# Patient Record
Sex: Female | Born: 1990 | Race: White | Hispanic: No | Marital: Single | Smoking: Former smoker
Health system: Southern US, Community
[De-identification: ages and names within clinical notes are randomized; demographics above are authoritative.]

## PROBLEM LIST (undated history)

## (undated) DIAGNOSIS — D649 Anemia, unspecified: Secondary | ICD-10-CM

## (undated) DIAGNOSIS — N390 Urinary tract infection, site not specified: Secondary | ICD-10-CM

## (undated) HISTORY — PX: NO PAST SURGERIES: SHX2092

## (undated) HISTORY — PX: DILATION AND CURETTAGE OF UTERUS: SHX78

---

## 2012-01-02 ENCOUNTER — Emergency Department (HOSPITAL_BASED_OUTPATIENT_CLINIC_OR_DEPARTMENT_OTHER)
Admission: EM | Admit: 2012-01-02 | Discharge: 2012-01-02 | Disposition: A | Discharge status: Home / Self Care | Payer: Self-pay | Attending: Emergency Medicine | Admitting: Emergency Medicine

## 2012-01-02 ENCOUNTER — Encounter (HOSPITAL_BASED_OUTPATIENT_CLINIC_OR_DEPARTMENT_OTHER): Payer: Self-pay | Admitting: Emergency Medicine

## 2012-01-02 DIAGNOSIS — B86 Scabies: Secondary | ICD-10-CM | POA: Insufficient documentation

## 2012-01-02 MED ORDER — HYDROXYZINE HCL 25 MG PO TABS: 25.0000 mg | ORAL_TABLET | Freq: Four times a day (QID) | ORAL | Status: AC

## 2012-01-02 MED ORDER — PERMETHRIN 5 % EX CREA: TOPICAL_CREAM | CUTANEOUS | Status: AC

## 2012-01-02 NOTE — ED Notes (Signed)
Itchy rash to webs of hands, feet and abdomen.  Roommate was diagnosed with scabies two weeks ago.

## 2012-01-02 NOTE — ED Provider Notes (Signed)
History     CSN: [id number]  Arrival date & time 01/02/12  1000   First MD Initiated Contact with Patient 01/02/12 1035      Chief Complaint  Patient presents with  . Rash    (Consider location/radiation/quality/duration/timing/severity/associated sxs/prior treatment) HPI Comments: Patient presents with itchy rash that has been going on about 2 weeks now. It's mostly on her hands and her feet but also in her abdomen. Her boyfriend has the same rash. They were also recently exposed to scabies. Denies a fevers cough congestion or other recent illnesses. She has been using Benadryl with no relief.  Patient is a 21 y.o. female presenting with rash. The history is provided by the patient.  Rash  This is a new problem.    History reviewed. No pertinent past medical history.  History reviewed. No pertinent past surgical history.  History reviewed. No pertinent family history.  History  Substance Use Topics  . Smoking status: Not on file  . Smokeless tobacco: Not on file  . Alcohol Use: Not on file    OB History    Grav Para Term Preterm Abortions TAB SAB Ect Mult Living                  Review of Systems  Constitutional: Negative for chills and fatigue.  HENT: Negative for facial swelling.   Respiratory: Negative for cough and shortness of breath.   Cardiovascular: Negative for chest pain.  Musculoskeletal: Negative for joint swelling.  Skin: Positive for rash.  Neurological: Negative for headaches.    Allergies  Review of patient's allergies indicates no known allergies.  Home Medications   Current Outpatient Rx  Name Route Sig Dispense Refill  . CIPROFLOXACIN HCL 500 MG PO TABS Oral Take 500 mg by mouth 2 (two) times daily.    Marland Kitchen HYDROXYZINE HCL 25 MG PO TABS Oral Take 1 tablet (25 mg total) by mouth every 6 (six) hours. 12 tablet 0  . PERMETHRIN 5 % EX CREA  Apply to affected area once 60 g 0    BP 117/70  Pulse 97  Temp(Src) 98.3 F (36.8 C) (Oral)  Resp  18  SpO2 100%  LMP 11/28/2011  Physical Exam  Constitutional: She is oriented to person, place, and time. She appears well-developed and well-nourished.  HENT:  Head: Normocephalic and atraumatic.  Eyes: Pupils are equal, round, and reactive to light.  Neck: Normal range of motion. Neck supple.  Cardiovascular: Normal rate, regular rhythm and normal heart sounds.   Pulmonary/Chest: Effort normal and breath sounds normal. No respiratory distress. She has no wheezes. She has no rales. She exhibits no tenderness.  Abdominal: Soft. Bowel sounds are normal. There is no tenderness. There is no rebound and no guarding.  Musculoskeletal: Normal range of motion. She exhibits no edema.  Lymphadenopathy:    She has no cervical adenopathy.  Neurological: She is alert and oriented to person, place, and time.  Skin: Skin is warm and dry. Rash noted.       There are small papules present to the dorsal surface of both hands including the web spaces of the hands. There is also papules on her feet and abdomen. There is no petechiae no purpura no signs of cellulitis no vesicular lesions.  Psychiatric: She has a normal mood and affect.    ED Course  Procedures (including critical care time)  Labs Reviewed - No data to display No results found.   1. Scabies  MDM  Patient with apparent scabies present no signs of cellulitis present will treat with Elimite cream and Atarax for symptomatic care        Rolan Bucco, MD 01/02/12 1525

## 2012-06-09 ENCOUNTER — Encounter (HOSPITAL_BASED_OUTPATIENT_CLINIC_OR_DEPARTMENT_OTHER): Payer: Self-pay | Admitting: Emergency Medicine

## 2012-06-09 ENCOUNTER — Emergency Department (HOSPITAL_BASED_OUTPATIENT_CLINIC_OR_DEPARTMENT_OTHER)
Admission: EM | Admit: 2012-06-09 | Discharge: 2012-06-09 | Disposition: A | Discharge status: Home / Self Care | Payer: Self-pay | Attending: Emergency Medicine | Admitting: Emergency Medicine

## 2012-06-09 DIAGNOSIS — N390 Urinary tract infection, site not specified: Secondary | ICD-10-CM | POA: Insufficient documentation

## 2012-06-09 DIAGNOSIS — F17200 Nicotine dependence, unspecified, uncomplicated: Secondary | ICD-10-CM | POA: Insufficient documentation

## 2012-06-09 HISTORY — DX: Urinary tract infection, site not specified: N39.0

## 2012-06-09 LAB — URINALYSIS, ROUTINE W REFLEX MICROSCOPIC
Bilirubin Urine: NEGATIVE
Nitrite: NEGATIVE
Protein, ur: 30 mg/dL — AB
Specific Gravity, Urine: 1.013 (ref 1.005–1.030)
Urobilinogen, UA: 0.2 mg/dL (ref 0.0–1.0)

## 2012-06-09 LAB — PREGNANCY, URINE: Preg Test, Ur: NEGATIVE

## 2012-06-09 LAB — URINE MICROSCOPIC-ADD ON

## 2012-06-09 MED ORDER — SULFAMETHOXAZOLE-TRIMETHOPRIM 800-160 MG PO TABS: 1.0000 | ORAL_TABLET | Freq: Two times a day (BID) | ORAL | Status: AC

## 2012-06-09 NOTE — Discharge Instructions (Signed)

## 2012-06-09 NOTE — ED Provider Notes (Signed)
Medical screening examination/treatment/procedure(s) were performed by non-physician practitioner and as supervising physician I was immediately available for consultation/collaboration.  Anjenette Gerbino T Kartier Bennison, MD 06/09/12 2317 

## 2012-06-09 NOTE — ED Notes (Signed)
Pt with history of uti, +dysruria, and frequent urination

## 2012-06-09 NOTE — ED Provider Notes (Signed)
History     CSN: [id number]  Arrival date & time 06/09/12  4098   First MD Initiated Contact with Patient 06/09/12 1927      Chief Complaint  Patient presents with  . Urinary Tract Infection    (Consider location/radiation/quality/duration/timing/severity/associated sxs/prior treatment) HPI Comments: Pt c/o burning with urination  Patient is a 21 y.o. female presenting with urinary tract infection. The history is provided by the patient. No language interpreter was used.  Urinary Tract Infection This is a recurrent problem. The current episode started in the past 7 days. The problem has been unchanged. Pertinent negatives include no fever. Nothing aggravates the symptoms. She has tried nothing for the symptoms.    Past Medical History  Diagnosis Date  . UTI (urinary tract infection)     History reviewed. No pertinent past surgical history.  History reviewed. No pertinent family history.  History  Substance Use Topics  . Smoking status: Current Everyday Smoker -- 0.5 packs/day    Types: Cigarettes  . Smokeless tobacco: Not on file  . Alcohol Use: Yes     ocassionally    OB History    Grav Para Term Preterm Abortions TAB SAB Ect Mult Living                  Review of Systems  Constitutional: Negative for fever.  Respiratory: Negative.   Cardiovascular: Negative.   Neurological: Negative.     Allergies  Review of patient's allergies indicates no known allergies.  Home Medications   Current Outpatient Rx  Name Route Sig Dispense Refill  . BENADRYL ALLERGY PO Oral Take 1 tablet by mouth daily as needed. Patient used this medication a rash.    . IBUPROFEN 200 MG PO TABS Oral Take 800 mg by mouth every 6 (six) hours as needed. Patient used this medication for her tooth pain.    Marland Kitchen NYQUIL PO Oral Take 2 capsules by mouth daily as needed. Patient used this medication for her cold symptoms.    Marland Kitchen CIPROFLOXACIN HCL 500 MG PO TABS Oral Take 500 mg by mouth 2 (two)  times daily.    . SULFAMETHOXAZOLE-TRIMETHOPRIM 800-160 MG PO TABS Oral Take 1 tablet by mouth 2 (two) times daily. 6 tablet 0    BP 126/63  Pulse 116  Temp 98.8 F (37.1 C) (Oral)  Resp 18  Ht 5\' 5"  (1.651 m)  Wt 160 lb (72.576 kg)  BMI 26.63 kg/m2  SpO2 100%  LMP 05/26/2012  Physical Exam  Nursing note and vitals reviewed. Constitutional: She is oriented to person, place, and time. She appears well-developed and well-nourished.  HENT:  Head: Normocephalic and atraumatic.  Cardiovascular: Normal rate and regular rhythm.   Pulmonary/Chest: Effort normal and breath sounds normal.  Abdominal: Soft. Bowel sounds are normal. There is no tenderness. There is no CVA tenderness.  Musculoskeletal: Normal range of motion.  Neurological: She is alert and oriented to person, place, and time.  Skin: Skin is warm and dry.  Psychiatric: She has a normal mood and affect.    ED Course  Procedures (including critical care time)  Labs Reviewed  URINALYSIS, ROUTINE W REFLEX MICROSCOPIC - Abnormal; Notable for the following:    APPearance CLOUDY (*)     Hgb urine dipstick LARGE (*)     Protein, ur 30 (*)     Leukocytes, UA LARGE (*)     All other components within normal limits  URINE MICROSCOPIC-ADD ON - Abnormal; Notable for the following:  Squamous Epithelial / LPF FEW (*)     Bacteria, UA FEW (*)     All other components within normal limits  PREGNANCY, URINE  URINE CULTURE   No results found.   1. UTI (lower urinary tract infection)       MDM  Abdomen is benign:willt treat for simple uti       Teressa Lower, NP 06/09/12 2106

## 2012-06-11 LAB — URINE CULTURE: Colony Count: >=100000

## 2012-06-12 NOTE — ED Notes (Signed)
Results received from [facility]. (+) URNC -> >/= 100,000 colonies E Coli.  Rx for Sulfa-Trimeth -> Resistant to the same.  Chart to MD office for review.

## 2012-06-16 NOTE — ED Notes (Signed)
attempts made to contact patient by x 2.

## 2012-06-16 NOTE — ED Notes (Signed)
Change to Keflex 500 mg TID x 1 week per Dr Oletta Lamas.

## 2012-09-08 ENCOUNTER — Encounter (HOSPITAL_BASED_OUTPATIENT_CLINIC_OR_DEPARTMENT_OTHER): Payer: Self-pay | Admitting: *Deleted

## 2012-09-08 ENCOUNTER — Emergency Department (HOSPITAL_BASED_OUTPATIENT_CLINIC_OR_DEPARTMENT_OTHER)
Admission: EM | Admit: 2012-09-08 | Discharge: 2012-09-08 | Disposition: A | Discharge status: Home / Self Care | Payer: Self-pay | Attending: Emergency Medicine | Admitting: Emergency Medicine

## 2012-09-08 DIAGNOSIS — S025XXA Fracture of tooth (traumatic), initial encounter for closed fracture: Secondary | ICD-10-CM | POA: Insufficient documentation

## 2012-09-08 DIAGNOSIS — K089 Disorder of teeth and supporting structures, unspecified: Secondary | ICD-10-CM | POA: Insufficient documentation

## 2012-09-08 DIAGNOSIS — X58XXXA Exposure to other specified factors, initial encounter: Secondary | ICD-10-CM | POA: Insufficient documentation

## 2012-09-08 DIAGNOSIS — F17200 Nicotine dependence, unspecified, uncomplicated: Secondary | ICD-10-CM | POA: Insufficient documentation

## 2012-09-08 DIAGNOSIS — K0889 Other specified disorders of teeth and supporting structures: Secondary | ICD-10-CM

## 2012-09-08 MED ORDER — BUPIVACAINE-EPINEPHRINE PF 0.5-1:200000 % IJ SOLN
10.0000 mL | Freq: Once | INTRAMUSCULAR | Status: AC
  Administered 2012-09-08: 50 mg
  Filled 2012-09-08: qty 10

## 2012-09-08 MED ORDER — BUPIVACAINE-EPINEPHRINE (PF) 0.5% -1:200000 IJ SOLN
INTRAMUSCULAR | Status: AC
  Filled 2012-09-08: qty 1.8

## 2012-09-08 MED ORDER — TRAMADOL HCL 50 MG PO TABS
50.0000 mg | ORAL_TABLET | Freq: Four times a day (QID) | ORAL | Status: DC | PRN
Start: 2012-09-08 — End: 2019-11-06

## 2012-09-08 NOTE — ED Notes (Signed)
Discharge instructions reviewed. Pt verbalized understanding.  

## 2012-09-08 NOTE — ED Notes (Signed)
Pain started on left side of mouth yesterday today cant tell which tooth pain shooting into head has taken tylenol and ibuprofen and benadryl

## 2012-09-21 NOTE — ED Provider Notes (Signed)
History     CSN: [id number]  Arrival date & time 09/08/12  4401   First MD Initiated Contact with Patient 09/08/12 [phone]      Chief Complaint  Patient presents with  . Dental Pain    (Consider location/radiation/quality/duration/timing/severity/associated sxs/prior treatment) HPIKristen Wagner is a 21 y.o. female presenting with left-sided facial pain due to   tooth requiring dental work. This is been going on for months. Tramadol has worked for her in the past but she's out of this medicine. Her pain is currently 10/10, shooting pain shoots into her face, associated with headache, no associated fevers, chills, difficulty swallowing, swelling of her tongue, stiff neck.  She says she has not gone to the dentist because she does not have insurance.   Past Medical History  Diagnosis Date  . UTI (urinary tract infection)     History reviewed. No pertinent past surgical history.  History reviewed. No pertinent family history.  History  Substance Use Topics  . Smoking status: Current Every Day Smoker -- 0.5 packs/day    Types: Cigarettes  . Smokeless tobacco: Not on file  . Alcohol Use: Yes     ocassionally    OB History    Grav Para Term Preterm Abortions TAB SAB Ect Mult Living                  Review of Systems At least 10pt or greater review of systems completed and are negative except where specified in the HPI.  Allergies  Review of patient's allergies indicates no known allergies.  Home Medications   Current Outpatient Rx  Name Route Sig Dispense Refill  . CIPROFLOXACIN HCL 500 MG PO TABS Oral Take 500 mg by mouth 2 (two) times daily.    Marland Kitchen BENADRYL ALLERGY PO Oral Take 1 tablet by mouth daily as needed. Patient used this medication a rash.    . IBUPROFEN 200 MG PO TABS Oral Take 800 mg by mouth every 6 (six) hours as needed. Patient used this medication for her tooth pain.    Marland Kitchen NYQUIL PO Oral Take 2 capsules by mouth daily as needed. Patient used this  medication for her cold symptoms.    . TRAMADOL HCL 50 MG PO TABS Oral Take 1-2 tablets (50-100 mg total) by mouth every 6 (six) hours as needed for pain. 10 tablet 0    BP 114/66  Pulse 71  Temp 98.3 F (36.8 C)  Resp 20  SpO2 100%  LMP 08/07/2012  Physical Exam  Nursing notes reviewed.  Electronic medical record reviewed. VITAL SIGNS:   Filed Vitals:   09/08/12 0730 09/08/12 0731  BP: 114/66   Pulse: 71   Temp:  98.3 F (36.8 C)  TempSrc: Oral   Resp: 20   SpO2: 100%    CONSTITUTIONAL: Awake, oriented, appears non-toxic HENT: Atraumatic, normocephalic, oral mucosa pink and moist, airway patent.  Poor dental hygiene. Carries to left-sided molars. No tenderness to percussion in the molars and no periapical abscess seen-no erythema, no gingival irritation or gingivitis.  Nares patent without drainage. External ears normal. EYES: Conjunctiva clear, EOMI, PERRLA NECK: Trachea midline, non-tender, supple CARDIOVASCULAR: Normal heart rate, Normal rhythm, No murmurs, rubs, gallops PULMONARY/CHEST: Clear to auscultation, no rhonchi, wheezes, or rales. Symmetrical breath sounds. Non-tender. ABDOMINAL: Non-distended, soft, non-tender - no rebound or guarding.  BS normal. NEUROLOGIC: Non-focal, moving all four extremities, no gross sensory or motor deficits. EXTREMITIES: No clubbing, cyanosis, or edema SKIN: Warm, Dry, No erythema, No rash  ED Course  Procedures (including critical care time)  Labs Reviewed - No data to display No results found.   1. Tooth pain   2. Tooth fracture       MDM  Jacqueline Wagner is a 21 y.o. female presenting with dental pain-she obviously needs some dental work by the state of her teeth, she says this is been a long-standing problem. Given her some resources and finding it [facility] and tramadol for pain relief since it seems to be working well. Patient does not have any focal area suggesting an  abscess tooth or deeper space infection.   I explained the diagnosis and have given explicit precautions to return to the ER including fevers, chills, difficulty swallowing, stiff neck or any other new or worsening symptoms. The patient understands and accepts the medical plan as it's been dictated and I have answered their questions. Discharge instructions concerning home care and prescriptions have been given.  The patient is STABLE and is discharged to home in good condition.          Jones Skene, MD 09/21/12 1143

## 2012-09-24 ENCOUNTER — Emergency Department (HOSPITAL_BASED_OUTPATIENT_CLINIC_OR_DEPARTMENT_OTHER)
Admission: EM | Admit: 2012-09-24 | Discharge: 2012-09-24 | Disposition: A | Discharge status: Home / Self Care | Payer: Self-pay | Attending: Emergency Medicine | Admitting: Emergency Medicine

## 2012-09-24 ENCOUNTER — Encounter (HOSPITAL_BASED_OUTPATIENT_CLINIC_OR_DEPARTMENT_OTHER): Payer: Self-pay | Admitting: *Deleted

## 2012-09-24 DIAGNOSIS — N390 Urinary tract infection, site not specified: Secondary | ICD-10-CM | POA: Insufficient documentation

## 2012-09-24 DIAGNOSIS — F17200 Nicotine dependence, unspecified, uncomplicated: Secondary | ICD-10-CM | POA: Insufficient documentation

## 2012-09-24 LAB — URINALYSIS, ROUTINE W REFLEX MICROSCOPIC
Bilirubin Urine: NEGATIVE
Hgb urine dipstick: NEGATIVE
Ketones, ur: NEGATIVE mg/dL
Specific Gravity, Urine: 1.025 (ref 1.005–1.030)
pH: 6.5 (ref 5.0–8.0)

## 2012-09-24 MED ORDER — CIPROFLOXACIN HCL 500 MG PO TABS: 250.0000 mg | ORAL_TABLET | Freq: Two times a day (BID) | ORAL | Status: DC

## 2012-09-24 NOTE — ED Provider Notes (Signed)
History     CSN: [id number]  Arrival date & time 09/24/12  7829   First MD Initiated Contact with Patient 09/24/12 1937      Chief Complaint  Patient presents with  . Urinary Frequency    (Consider location/radiation/quality/duration/timing/severity/associated sxs/prior treatment) HPI Comments: Patient is a healthy 21 year old female with a 2 day history of urinary frequency and cloudy urine. She reports noticing these symptoms every time she urinates for the past 2 days. She reports frequent UTI's despite following precautionary advice for prevention. Patient denies pain. No aggravating/alleviating factors. She has not tried anything for symptoms. She denies fever, NVD, chest pain, SOB, abdominal pain, hematuria.    Past Medical History  Diagnosis Date  . UTI (urinary tract infection)     History reviewed. No pertinent past surgical history.  History reviewed. No pertinent family history.  History  Substance Use Topics  . Smoking status: Current Every Day Smoker -- 0.5 packs/day    Types: Cigarettes  . Smokeless tobacco: Not on file  . Alcohol Use: Yes     ocassionally    OB History    Grav Para Term Preterm Abortions TAB SAB Ect Mult Living                  Review of Systems  Genitourinary: Positive for frequency.  All other systems reviewed and are negative.    Allergies  Review of patient's allergies indicates no known allergies.  Home Medications   Current Outpatient Rx  Name Route Sig Dispense Refill  . CIPROFLOXACIN HCL 500 MG PO TABS Oral Take 500 mg by mouth 2 (two) times daily.    Marland Kitchen BENADRYL ALLERGY PO Oral Take 1 tablet by mouth daily as needed. Patient used this medication a rash.    . IBUPROFEN 200 MG PO TABS Oral Take 800 mg by mouth every 6 (six) hours as needed. Patient used this medication for her tooth pain.    Marland Kitchen NYQUIL PO Oral Take 2 capsules by mouth daily as needed. Patient used this medication for her cold symptoms.    . TRAMADOL HCL  50 MG PO TABS Oral Take 1-2 tablets (50-100 mg total) by mouth every 6 (six) hours as needed for pain. 10 tablet 0    BP 121/79  Pulse 110  Temp 98.4 F (36.9 C) (Oral)  Resp 18  Ht 5\' 4"  (1.626 m)  Wt 170 lb (77.111 kg)  BMI 29.18 kg/m2  SpO2 100%  LMP 09/07/2012  Physical Exam  Nursing note and vitals reviewed. Constitutional: She appears well-developed and well-nourished. No distress.  HENT:  Head: Normocephalic and atraumatic.  Eyes: Conjunctivae normal are normal. No scleral icterus.  Neck: Normal range of motion. Neck supple.  Cardiovascular: Normal rate and regular rhythm.  Exam reveals no gallop and no friction rub.   No murmur heard. Pulmonary/Chest: Effort normal and breath sounds normal. She has no wheezes. She has no rales. She exhibits no tenderness.  Abdominal: Soft. There is no tenderness.  Musculoskeletal: Normal range of motion.  Neurological: She is alert.       Speech is goal-oriented. Moves limbs without ataxia.   Skin: Skin is warm and dry.  Psychiatric: She has a normal mood and affect. Her behavior is normal.    ED Course  Procedures (including critical care time)   Labs Reviewed  PREGNANCY, URINE  URINALYSIS, ROUTINE W REFLEX MICROSCOPIC  URINE CULTURE   No results found.   1. UTI (urinary tract infection)  MDM  8:39 PM Patient's urine shows UTI. I will treat patient's infection with Cipro due to previous urine culture showing E. Coli. No further evaluation needed at this time. Patient instructed to follow up with primary care or urology for further evaluation of frequent UTIs. Patient expresses understanding and will follow up when she has insurance starting next month.         Emilia Beck, PA-C 09/25/12 0013

## 2012-09-24 NOTE — ED Notes (Addendum)
Pt states she has a hx of frequent UTI's in spite of using the necessary precautions to avoid same. Seen here 1 month ago for UTI and this one has been going on for about a week. Concerned she is a diabetic. Family hx Type 2 FSBS 142 at triage.

## 2012-09-26 NOTE — ED Provider Notes (Signed)
Medical screening examination/treatment/procedure(s) were performed by non-physician practitioner and as supervising physician I was immediately available for consultation/collaboration.   Gwyneth Sprout, MD 09/26/12 [phone]

## 2012-09-27 LAB — URINE CULTURE: Colony Count: 80000

## 2012-09-28 NOTE — ED Notes (Signed)
+   Urine Patient treated with Cipro-sensitive to same-chart appended per protocol MD. 

## 2015-03-07 ENCOUNTER — Emergency Department (HOSPITAL_BASED_OUTPATIENT_CLINIC_OR_DEPARTMENT_OTHER)
Admission: EM | Admit: 2015-03-07 | Discharge: 2015-03-07 | Disposition: A | Discharge status: Home / Self Care | Payer: 59 | Attending: Emergency Medicine | Admitting: Emergency Medicine

## 2015-03-07 ENCOUNTER — Encounter (HOSPITAL_BASED_OUTPATIENT_CLINIC_OR_DEPARTMENT_OTHER): Payer: Self-pay | Admitting: Emergency Medicine

## 2015-03-07 DIAGNOSIS — Z87891 Personal history of nicotine dependence: Secondary | ICD-10-CM | POA: Diagnosis not present

## 2015-03-07 DIAGNOSIS — R350 Frequency of micturition: Secondary | ICD-10-CM | POA: Diagnosis present

## 2015-03-07 DIAGNOSIS — Z792 Long term (current) use of antibiotics: Secondary | ICD-10-CM | POA: Insufficient documentation

## 2015-03-07 DIAGNOSIS — Z79899 Other long term (current) drug therapy: Secondary | ICD-10-CM | POA: Insufficient documentation

## 2015-03-07 DIAGNOSIS — N390 Urinary tract infection, site not specified: Secondary | ICD-10-CM | POA: Insufficient documentation

## 2015-03-07 LAB — URINALYSIS, ROUTINE W REFLEX MICROSCOPIC
Bilirubin Urine: NEGATIVE
GLUCOSE, UA: NEGATIVE mg/dL
HGB URINE DIPSTICK: NEGATIVE
Ketones, ur: 15 mg/dL — AB
Nitrite: POSITIVE — AB
PH: 5 (ref 5.0–8.0)
PROTEIN: NEGATIVE mg/dL
SPECIFIC GRAVITY, URINE: 1.039 — AB (ref 1.005–1.030)
Urobilinogen, UA: 1 mg/dL (ref 0.0–1.0)

## 2015-03-07 LAB — URINE MICROSCOPIC-ADD ON

## 2015-03-07 LAB — PREGNANCY, URINE: PREG TEST UR: NEGATIVE

## 2015-03-07 MED ORDER — PHENAZOPYRIDINE HCL 200 MG PO TABS: 200.0000 mg | ORAL_TABLET | Freq: Three times a day (TID) | ORAL | Status: DC

## 2015-03-07 MED ORDER — NITROFURANTOIN MONOHYD MACRO 100 MG PO CAPS: 100.0000 mg | ORAL_CAPSULE | Freq: Two times a day (BID) | ORAL | Status: DC

## 2015-03-07 MED ORDER — PHENAZOPYRIDINE HCL 100 MG PO TABS
200.0000 mg | ORAL_TABLET | Freq: Once | ORAL | Status: AC
  Administered 2015-03-07: 200 mg via ORAL

## 2015-03-07 MED ORDER — PHENAZOPYRIDINE HCL 100 MG PO TABS
ORAL_TABLET | ORAL | Status: AC
  Filled 2015-03-07: qty 2

## 2015-03-07 MED ORDER — NITROFURANTOIN MONOHYD MACRO 100 MG PO CAPS
ORAL_CAPSULE | ORAL | Status: AC
  Filled 2015-03-07: qty 1

## 2015-03-07 MED ORDER — NITROFURANTOIN MONOHYD MACRO 100 MG PO CAPS
100.0000 mg | ORAL_CAPSULE | Freq: Once | ORAL | Status: AC
  Administered 2015-03-07: 100 mg via ORAL

## 2015-03-07 NOTE — ED Provider Notes (Signed)
CSN: [id number]     Arrival date & time 03/07/15  0138 History   First MD Initiated Contact with Patient 03/07/15 0153     Chief Complaint  Patient presents with  . Urinary Frequency     (Consider location/radiation/quality/duration/timing/severity/associated sxs/prior Treatment) Patient is a 24 y.o. female presenting with frequency. The history is provided by the patient.  Urinary Frequency This is a new problem. The current episode started 12 to 24 hours ago. The problem occurs constantly. The problem has not changed since onset.Pertinent negatives include no abdominal pain. Nothing aggravates the symptoms. Nothing relieves the symptoms. Treatments tried: azo. The treatment provided no relief.    Past Medical History  Diagnosis Date  . UTI (urinary tract infection)    History reviewed. No pertinent past surgical history. No family history on file. History  Substance Use Topics  . Smoking status: Former Games developermoker  . Smokeless tobacco: Not on file  . Alcohol Use: No   OB History    No data available     Review of Systems  Gastrointestinal: Negative for abdominal pain.  Genitourinary: Positive for dysuria and frequency.  All other systems reviewed and are negative.     Allergies  Review of patient's allergies indicates no known allergies.  Home Medications   Prior to Admission medications   Medication Sig Start Date End Date Taking? Authorizing Provider  ciprofloxacin (CIPRO) 500 MG tablet Take 500 mg by mouth 2 (two) times daily.    Historical Provider, MD  ciprofloxacin (CIPRO) 500 MG tablet Take 0.5 tablets (250 mg total) by mouth 2 (two) times daily. 09/24/12   Kaitlyn Szekalski, PA-C  DiphenhydrAMINE HCl (BENADRYL ALLERGY PO) Take 1 tablet by mouth daily as needed. Patient used this medication a rash.    Historical Provider, MD  ibuprofen (ADVIL,MOTRIN) 200 MG tablet Take 800 mg by mouth every 6 (six) hours as needed. Patient used this medication for her tooth pain.     Historical Provider, MD  nitrofurantoin, macrocrystal-monohydrate, (MACROBID) 100 MG capsule Take 1 capsule (100 mg total) by mouth 2 (two) times daily. X 3 days 03/07/15   Quay Simkin, MD  phenazopyridine (PYRIDIUM) 200 MG tablet Take 1 tablet (200 mg total) by mouth 3 (three) times daily. 03/07/15   Pearson Reasons, MD  Pseudoeph-Doxylamine-DM-APAP (NYQUIL PO) Take 2 capsules by mouth daily as needed. Patient used this medication for her cold symptoms.    Historical Provider, MD  traMADol (ULTRAM) 50 MG tablet Take 1-2 tablets (50-100 mg total) by mouth every 6 (six) hours as needed for pain. 09/08/12   John-Adam Bonk, MD   BP 132/74 mmHg  Pulse 78  Temp(Src) 99.1 F (37.3 C) (Oral)  Resp 16  Ht 5\' 4"  (1.626 m)  Wt 197 lb (89.359 kg)  BMI 33.80 kg/m2  SpO2 99%  LMP 03/01/2015 (Exact Date) Physical Exam  Constitutional: She is oriented to person, place, and time. She appears well-developed and well-nourished. No distress.  HENT:  Head: Normocephalic and atraumatic.  Mouth/Throat: Oropharynx is clear and moist.  Eyes: Conjunctivae are normal. Pupils are equal, round, and reactive to light.  Neck: Normal range of motion. Neck supple.  Cardiovascular: Normal rate and regular rhythm.   Pulmonary/Chest: Effort normal and breath sounds normal. No respiratory distress. She has no wheezes. She has no rales.  Abdominal: Soft. Bowel sounds are normal. There is no tenderness. There is no rebound and no guarding.  Musculoskeletal: Normal range of motion.  Neurological: She is alert and oriented to  person, place, and time.  Skin: Skin is warm and dry.  Psychiatric: She has a normal mood and affect.    ED Course  Procedures (including critical care time) Labs Review Labs Reviewed  URINALYSIS, ROUTINE W REFLEX MICROSCOPIC - Abnormal; Notable for the following:    Color, Urine ORANGE (*)    Specific Gravity, Urine 1.039 (*)    Ketones, ur 15 (*)    Nitrite POSITIVE (*)    Leukocytes, UA  SMALL (*)    All other components within normal limits  URINE MICROSCOPIC-ADD ON - Abnormal; Notable for the following:    Bacteria, UA FEW (*)    All other components within normal limits  PREGNANCY, URINE    Imaging Review No results found.   EKG Interpretation None      MDM   Final diagnoses:  UTI (lower urinary tract infection)    Mild UTI, nitrites are likely from AZO.  Will write for macrobid x 3 days.  Follow up with your PMD for recheck    [place] Dodgen, MD 03/07/15 [phone]

## 2015-03-07 NOTE — ED Notes (Signed)
Urinary frequency since 4pm with dysuria.

## 2017-05-15 ENCOUNTER — Emergency Department (HOSPITAL_BASED_OUTPATIENT_CLINIC_OR_DEPARTMENT_OTHER): Payer: 59

## 2017-05-15 ENCOUNTER — Encounter (HOSPITAL_BASED_OUTPATIENT_CLINIC_OR_DEPARTMENT_OTHER): Payer: Self-pay | Admitting: Emergency Medicine

## 2017-05-15 ENCOUNTER — Emergency Department (HOSPITAL_BASED_OUTPATIENT_CLINIC_OR_DEPARTMENT_OTHER)
Admission: EM | Admit: 2017-05-15 | Discharge: 2017-05-15 | Disposition: A | Discharge status: Home / Self Care | Payer: 59 | Attending: Emergency Medicine | Admitting: Emergency Medicine

## 2017-05-15 DIAGNOSIS — Y939 Activity, unspecified: Secondary | ICD-10-CM | POA: Insufficient documentation

## 2017-05-15 DIAGNOSIS — Y92009 Unspecified place in unspecified non-institutional (private) residence as the place of occurrence of the external cause: Secondary | ICD-10-CM | POA: Diagnosis not present

## 2017-05-15 DIAGNOSIS — S42201A Unspecified fracture of upper end of right humerus, initial encounter for closed fracture: Secondary | ICD-10-CM | POA: Insufficient documentation

## 2017-05-15 DIAGNOSIS — S42291A Other displaced fracture of upper end of right humerus, initial encounter for closed fracture: Secondary | ICD-10-CM

## 2017-05-15 DIAGNOSIS — X500XXA Overexertion from strenuous movement or load, initial encounter: Secondary | ICD-10-CM | POA: Diagnosis not present

## 2017-05-15 DIAGNOSIS — Z79899 Other long term (current) drug therapy: Secondary | ICD-10-CM | POA: Diagnosis not present

## 2017-05-15 DIAGNOSIS — S43084A Other dislocation of right shoulder joint, initial encounter: Secondary | ICD-10-CM | POA: Diagnosis not present

## 2017-05-15 DIAGNOSIS — S43004A Unspecified dislocation of right shoulder joint, initial encounter: Secondary | ICD-10-CM

## 2017-05-15 DIAGNOSIS — F17210 Nicotine dependence, cigarettes, uncomplicated: Secondary | ICD-10-CM | POA: Insufficient documentation

## 2017-05-15 DIAGNOSIS — Y999 Unspecified external cause status: Secondary | ICD-10-CM | POA: Insufficient documentation

## 2017-05-15 DIAGNOSIS — S4991XA Unspecified injury of right shoulder and upper arm, initial encounter: Secondary | ICD-10-CM | POA: Diagnosis present

## 2017-05-15 MED ORDER — FENTANYL CITRATE (PF) 100 MCG/2ML IJ SOLN
100.0000 ug | Freq: Once | INTRAMUSCULAR | Status: AC
  Administered 2017-05-15: 100 ug via INTRAVENOUS

## 2017-05-15 MED ORDER — MELOXICAM 15 MG PO TABS: 15.0000 mg | ORAL_TABLET | Freq: Every day | ORAL | 0 refills | Status: DC

## 2017-05-15 MED ORDER — FENTANYL CITRATE (PF) 100 MCG/2ML IJ SOLN
50.0000 ug | Freq: Once | INTRAMUSCULAR | Status: AC
  Administered 2017-05-15: 50 ug via INTRAVENOUS
  Filled 2017-05-15: qty 2

## 2017-05-15 MED ORDER — HYDROCODONE-ACETAMINOPHEN 5-325 MG PO TABS: 1.0000 | ORAL_TABLET | ORAL | 0 refills | Status: DC | PRN

## 2017-05-15 MED ORDER — ONDANSETRON HCL 4 MG/2ML IJ SOLN
4.0000 mg | INTRAMUSCULAR | Status: AC
  Administered 2017-05-15: 4 mg via INTRAVENOUS
  Filled 2017-05-15: qty 2

## 2017-05-15 MED ORDER — FENTANYL CITRATE (PF) 100 MCG/2ML IJ SOLN
INTRAMUSCULAR | Status: AC
  Filled 2017-05-15: qty 2

## 2017-05-15 MED ORDER — PROPOFOL 10 MG/ML IV BOLUS
1.0000 mg/kg | Freq: Once | INTRAVENOUS | Status: AC
  Administered 2017-05-15: 77.6 mg via INTRAVENOUS
  Filled 2017-05-15: qty 20

## 2017-05-15 NOTE — ED Notes (Signed)
Patient transported to X-ray 

## 2017-05-15 NOTE — ED Triage Notes (Signed)
About 30 min ago was pulling her dogs a part from each other and injured right arm/shoulder . CMS intact to right wrist, swelling noted to shoulder

## 2017-05-15 NOTE — ED Notes (Signed)
Procedure consent obtained

## 2017-05-15 NOTE — Discharge Instructions (Signed)
You had a R shoulder dislocation. U also have a small fracture called a Hill-Sachs deformity which is a tiny avulsion of bone from the outside head of the humerus, which is the upper arm bone. This occurs as the shoulder is pulled out of its normal position when it rubs against one of the bones that makes up the shoulder joint. You may follow up with the orthopedic physician about your injuries. Do not use your arm until you see the orthopedist. He should continue to use the shoulder immobilizer until you follow up with the orthopedic physician as well. Do not drive with the shoulder immobilizer in place, and he should not drive until you're orthopedic physician assesses your shoulder for stability. Return to the emergency department for any new or worsening symptoms including recurrence of dislocation. Or any numbness or tingling in the hand,  sever swelling or other concerns

## 2017-05-15 NOTE — ED Notes (Addendum)
Sitting up right on stretcher, alert, c/o pf pain, aching to right shoulder. X-Ray at bedside for post reduction film. Strong right radial pulse, wiggles fingers easily, CMS intact

## 2017-05-15 NOTE — ED Provider Notes (Signed)
[facility] Provider Note   CSN: [id number] Arrival date & time: 05/15/17  [id number]     History   Chief Complaint Chief Complaint  Patient presents with  . Shoulder Injury    HPI Jacqueline Wagner is a 26 y.o. female who presents emergency Department with chief complaint of right arm pain. Patient states that she had her dog by his collar. He was lunging at another dog violently. She states that when the dog settled down. She had severe pain in her right shoulder and was unable to move her arm at all. She rates her pain at 10 out of 10. She denies any numbness or tingling in the hand. She has no previous injuries to this shoulder. It is right-hand dominant. She complains of no other injuries at this time.  HPI  Past Medical History:  Diagnosis Date  . UTI (urinary tract infection)     There are no active problems to display for this patient.   History reviewed. No pertinent surgical history.  OB History    No data available       Home Medications    Prior to Admission medications   Medication Sig Start Date End Date Taking? Authorizing Provider  PRESCRIPTION MEDICATION    Yes [provider]  ciprofloxacin (CIPRO) 500 MG tablet Take 500 mg by mouth 2 (two) times daily.    [provider]  ciprofloxacin (CIPRO) 500 MG tablet Take 0.5 tablets (250 mg total) by mouth 2 (two) times daily. 09/24/12   Emilia BeckSzekalski, Kaitlyn, PA-C  DiphenhydrAMINE HCl (BENADRYL ALLERGY PO) Take 1 tablet by mouth daily as needed. Patient used this medication a rash.    [provider]  ibuprofen (ADVIL,MOTRIN) 200 MG tablet Take 800 mg by mouth every 6 (six) hours as needed. Patient used this medication for her tooth pain.    [provider]  nitrofurantoin, macrocrystal-monohydrate, (MACROBID) 100 MG capsule Take 1 capsule (100 mg total) by mouth 2 (two) times daily. X 3 days 03/07/15   Nicanor AlconPalumbo, April, MD  phenazopyridine (PYRIDIUM) 200 MG tablet Take 1  tablet (200 mg total) by mouth 3 (three) times daily. 03/07/15   Palumbo, April, MD  Pseudoeph-Doxylamine-DM-APAP (NYQUIL PO) Take 2 capsules by mouth daily as needed. Patient used this medication for her cold symptoms.    [provider]  traMADol (ULTRAM) 50 MG tablet Take 1-2 tablets (50-100 mg total) by mouth every 6 (six) hours as needed for pain. 09/08/12   Jones SkeneBonk, John-Adam, MD    Family History No family history on file.  Social History Social History  Substance Use Topics  . Smoking status: Current Every Day Smoker    Packs/day: 0.50    Types: Cigarettes  . Smokeless tobacco: Never Used  . Alcohol use No     Allergies   Patient has no known allergies.   Review of Systems Review of Systems  Ten systems reviewed and are negative for acute change, except as noted in the HPI.  Physical Exam Updated Vital Signs BP (!) 133/92 (BP Location: Left Arm)   Pulse 92   Temp 98.4 F (36.9 C) (Oral)   Resp 18   Ht 5\' 4"  (1.626 m)   Wt 77.6 kg (171 lb)   LMP 05/07/2017 (Exact Date)   SpO2 100%   BMI 29.35 kg/m   Physical Exam  Constitutional: She is oriented to person, place, and time. She appears well-developed and well-nourished. No distress.  HENT:  Head: Normocephalic and atraumatic.  Eyes: Conjunctivae are normal. No scleral icterus.  Neck: Normal range of motion.  Cardiovascular: Normal rate, regular rhythm and normal heart sounds.  Exam reveals no gallop and no friction rub.   No murmur heard. Pulmonary/Chest: Effort normal and breath sounds normal. No respiratory distress.  Abdominal: Soft. Bowel sounds are normal. She exhibits no distension and no mass. There is no tenderness. There is no guarding.  Musculoskeletal:  Patient sitting with Her R arm resting forward on Her lap. She Guards tha R shoulder and will not allow ROM at this time due to pain. 2+ radial pulse. No deformities/ sulcus signs or step off.  Neurological: She is alert and oriented to person,  place, and time.  Skin: Skin is warm and dry. She is not diaphoretic.  Psychiatric: Her behavior is normal.  Nursing note and vitals reviewed.    ED Treatments / Results  Labs (all labs ordered are listed, but only abnormal results are displayed) Labs Reviewed - No data to display  EKG  EKG Interpretation None       Radiology No results found.  Procedures Reduction of dislocation Date/Time: 05/15/2017 11:32 AM Performed by: Arthor Captain Authorized by: Arthor Captain  Consent: Written consent obtained. Risks and benefits: risks, benefits and alternatives were discussed Consent given by: patient Patient understanding: patient states understanding of the procedure being performed Patient consent: the patient's understanding of the procedure matches consent given Procedure consent: procedure consent matches procedure scheduled Relevant documents: relevant documents present and verified Imaging studies: imaging studies available Patient identity confirmed: verbally with patient and provided demographic data Time out: Immediately prior to procedure a "time out" was called to verify the correct patient, procedure, equipment, support staff and site/side marked as required (11:08 AM). Preparation: Patient was prepped and draped in the usual sterile fashion. Patient tolerance: Patient tolerated the procedure well with no immediate complications Comments: Sling immobilizer applied and the patient was NVI    (including critical care time)  Medications Ordered in ED Medications  fentaNYL (SUBLIMAZE) injection 50 mcg (50 mcg Intravenous Given 05/15/17 0956)  ondansetron (ZOFRAN) injection 4 mg (4 mg Intravenous Given 05/15/17 0956)     Initial Impression / Assessment and Plan / ED Course  I have reviewed the triage vital signs and the nursing notes.  Pertinent labs & imaging results that were available during my care of the patient were reviewed by me and considered in my  medical decision making (see chart for details).  Clinical Course as of May 15 1138  Sat May 15, 2017  1031 Ant R shoulder dislocation. DG Shoulder Right [AH]  1132 Shoulder appears reduced on plain film  [AH]  1138 R shoulder  shows Hill sach's deformity  [AH]    Clinical Course User Index [AH] Arthor Captain, PA-C    Patient with right shoulder dislocation. Hill-Sachs deformity. Placed in sling immobilizer. Patient will be given pain medication, precautions for work and driving, and orthopedic follow-up. Patient appears safe for discharge at this time. Final Clinical Impressions(s) / ED Diagnoses   Final diagnoses:  Dislocation of right shoulder joint, initial encounter  Closed Hill-Sachs fracture of right humerus, initial encounter    New Prescriptions New Prescriptions   No medications on file     Arthor Captain, PA-C 05/15/17 Alba Cory, MD 05/16/17 [phone]

## 2017-05-15 NOTE — ED Notes (Signed)
Alert, talking with visitors. Given po fluids

## 2017-05-15 NOTE — ED Notes (Signed)
ED Provider at bedside. 

## 2017-05-20 ENCOUNTER — Ambulatory Visit (INDEPENDENT_AMBULATORY_CARE_PROVIDER_SITE_OTHER): Payer: 59 | Admitting: Orthopaedic Surgery

## 2017-05-20 ENCOUNTER — Ambulatory Visit (INDEPENDENT_AMBULATORY_CARE_PROVIDER_SITE_OTHER): Payer: 59

## 2017-05-20 DIAGNOSIS — S43014A Anterior dislocation of right humerus, initial encounter: Secondary | ICD-10-CM

## 2017-05-20 NOTE — Progress Notes (Signed)
   Office Visit Note   Patient: Jacqueline Wagner           Date of Birth: [birth year]           MRN: [id number] Visit Date: 05/20/2017              Requested by: No referring provider defined for this encounter. PCP: System, Pcp Not In   Assessment & Plan: Visit Diagnoses:  1. Anterior dislocation of right shoulder, initial encounter     Plan: Recommend sling at all times for 3 weeks. Recheck in 3 weeks. Anticipate beginning physical therapy at that time.  Follow-Up Instructions: Return in about 3 weeks (around 06/10/2017).   Orders:  Orders Placed This Encounter  Procedures  . XR Shoulder Right   No orders of the defined types were placed in this encounter.     Procedures: No procedures performed   Clinical Data: No additional findings.   Subjective: No chief complaint on file.   Jacqueline Wagner is a 26 year old who dislocated her right shoulder 5 days ago while walking a dog. This is her first dislocation. There was reduced in the ER. 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 She denies any numbness or tingling.    Review of Systems  Constitutional: Negative.   HENT: Negative.   Eyes: Negative.   Respiratory: Negative.   Cardiovascular: Negative.   Endocrine: Negative.   Musculoskeletal: Negative.   Neurological: Negative.   Hematological: Negative.   Psychiatric/Behavioral: Negative.   All other systems reviewed and are negative.    Objective: Vital Signs: LMP 05/07/2017 (Exact Date)   Physical Exam  Constitutional: She is oriented to person, place, and time. She appears well-developed and well-nourished.  HENT:  Head: Normocephalic and atraumatic.  Eyes: EOM are normal.  Neck: Neck supple.  Pulmonary/Chest: Effort normal.  Abdominal: Soft.  Neurological: She is alert and oriented to person, place, and time.  Skin: Skin is warm. Capillary refill takes less than 2 seconds.  Psychiatric: She has a normal mood and affect. Her behavior is normal. Judgment and  thought content normal.  Nursing note and vitals reviewed.   Ortho Exam Right shoulder exam shows intact axillary nerve function. She is neurovascularly intact distally. Specialty Comments:  No specialty comments available.  Imaging: Xr Shoulder Right  Result Date: 05/20/2017 Concentric right shoulder reduction    PMFS History: Patient Active Problem List   Diagnosis Date Noted  . Anterior dislocation of right shoulder 05/20/2017   Past Medical History:  Diagnosis Date  . UTI (urinary tract infection)     No family history on file.  No past surgical history on file. Social History   Occupational History  . Not on file.   Social History Main Topics  . Smoking status: Current Every Day Smoker    Packs/day: 0.50    Types: Cigarettes  . Smokeless tobacco: Never Used  . Alcohol use No  . Drug use: No  . Sexual activity: Yes    Birth control/ protection: Condom

## 2017-06-10 ENCOUNTER — Encounter (INDEPENDENT_AMBULATORY_CARE_PROVIDER_SITE_OTHER): Payer: Self-pay | Admitting: Orthopaedic Surgery

## 2017-06-10 ENCOUNTER — Ambulatory Visit (INDEPENDENT_AMBULATORY_CARE_PROVIDER_SITE_OTHER): Payer: 59 | Admitting: Orthopaedic Surgery

## 2017-06-10 DIAGNOSIS — S43014A Anterior dislocation of right humerus, initial encounter: Secondary | ICD-10-CM | POA: Diagnosis not present

## 2017-06-10 NOTE — Progress Notes (Signed)
   Office Visit Note   Patient: Jacqueline Wagner           Date of Birth: 1991-04-28           MRN: [id number] Visit Date: 06/10/2017              Requested by: No referring provider defined for this encounter. PCP: System, Pcp Not In   Assessment & Plan: Visit Diagnoses:  1. Anterior dislocation of right shoulder, initial encounter     Plan: Recommend physical therapy for joint mobilization and strengthening. Follow up with me as needed. Questions encouraged and answered.  Follow-Up Instructions: Return if symptoms worsen or fail to improve.   Orders:  No orders of the defined types were placed in this encounter.  No orders of the defined types were placed in this encounter.     Procedures: No procedures performed   Clinical Data: No additional findings.   Subjective: Chief Complaint  Patient presents with  . Right Shoulder - Follow-up    [name] follows up today for her right shoulder dislocation. She is 3 weeks out now. She is overall doing better. She denies any radiation of pain. She has been very compliant with the sling.    Review of Systems  Constitutional: Negative.   HENT: Negative.   Eyes: Negative.   Respiratory: Negative.   Cardiovascular: Negative.   Endocrine: Negative.   Musculoskeletal: Negative.   Neurological: Negative.   Hematological: Negative.   Psychiatric/Behavioral: Negative.   All other systems reviewed and are negative.    Objective: Vital Signs: There were no vitals taken for this visit.  Physical Exam  Constitutional: She is oriented to person, place, and time. She appears well-developed and well-nourished.  HENT:  Head: Normocephalic and atraumatic.  Eyes: EOM are normal.  Neck: Neck supple.  Pulmonary/Chest: Effort normal.  Abdominal: Soft.  Neurological: She is alert and oriented to person, place, and time.  Skin: Skin is warm. Capillary refill takes less than 2 seconds.  Psychiatric: She has a normal mood and  affect. Her behavior is normal. Judgment and thought content normal.  Nursing note and vitals reviewed.   Ortho Exam Right shoulder exam shows expected stiffness. Rotator cuff function is grossly intact. Specialty Comments:  No specialty comments available.  Imaging: No results found.   PMFS History: Patient Active Problem List   Diagnosis Date Noted  . Anterior dislocation of right shoulder 05/20/2017   Past Medical History:  Diagnosis Date  . UTI (urinary tract infection)     No family history on file.  No past surgical history on file. Social History   Occupational History  . Not on file.   Social History Main Topics  . Smoking status: Current Every Day Smoker    Packs/day: 0.50    Types: Cigarettes  . Smokeless tobacco: Never Used  . Alcohol use No  . Drug use: No  . Sexual activity: Yes    Birth control/ protection: Condom

## 2018-03-14 IMAGING — DX DG SHOULDER 2+V PORT*R*
1 series · 1 of 1 positions shown · non-contrast
Comparison: Earlier same day

CLINICAL DATA: Post reduction.

EXAM:
PORTABLE RIGHT SHOULDER

[shoulder grashey]
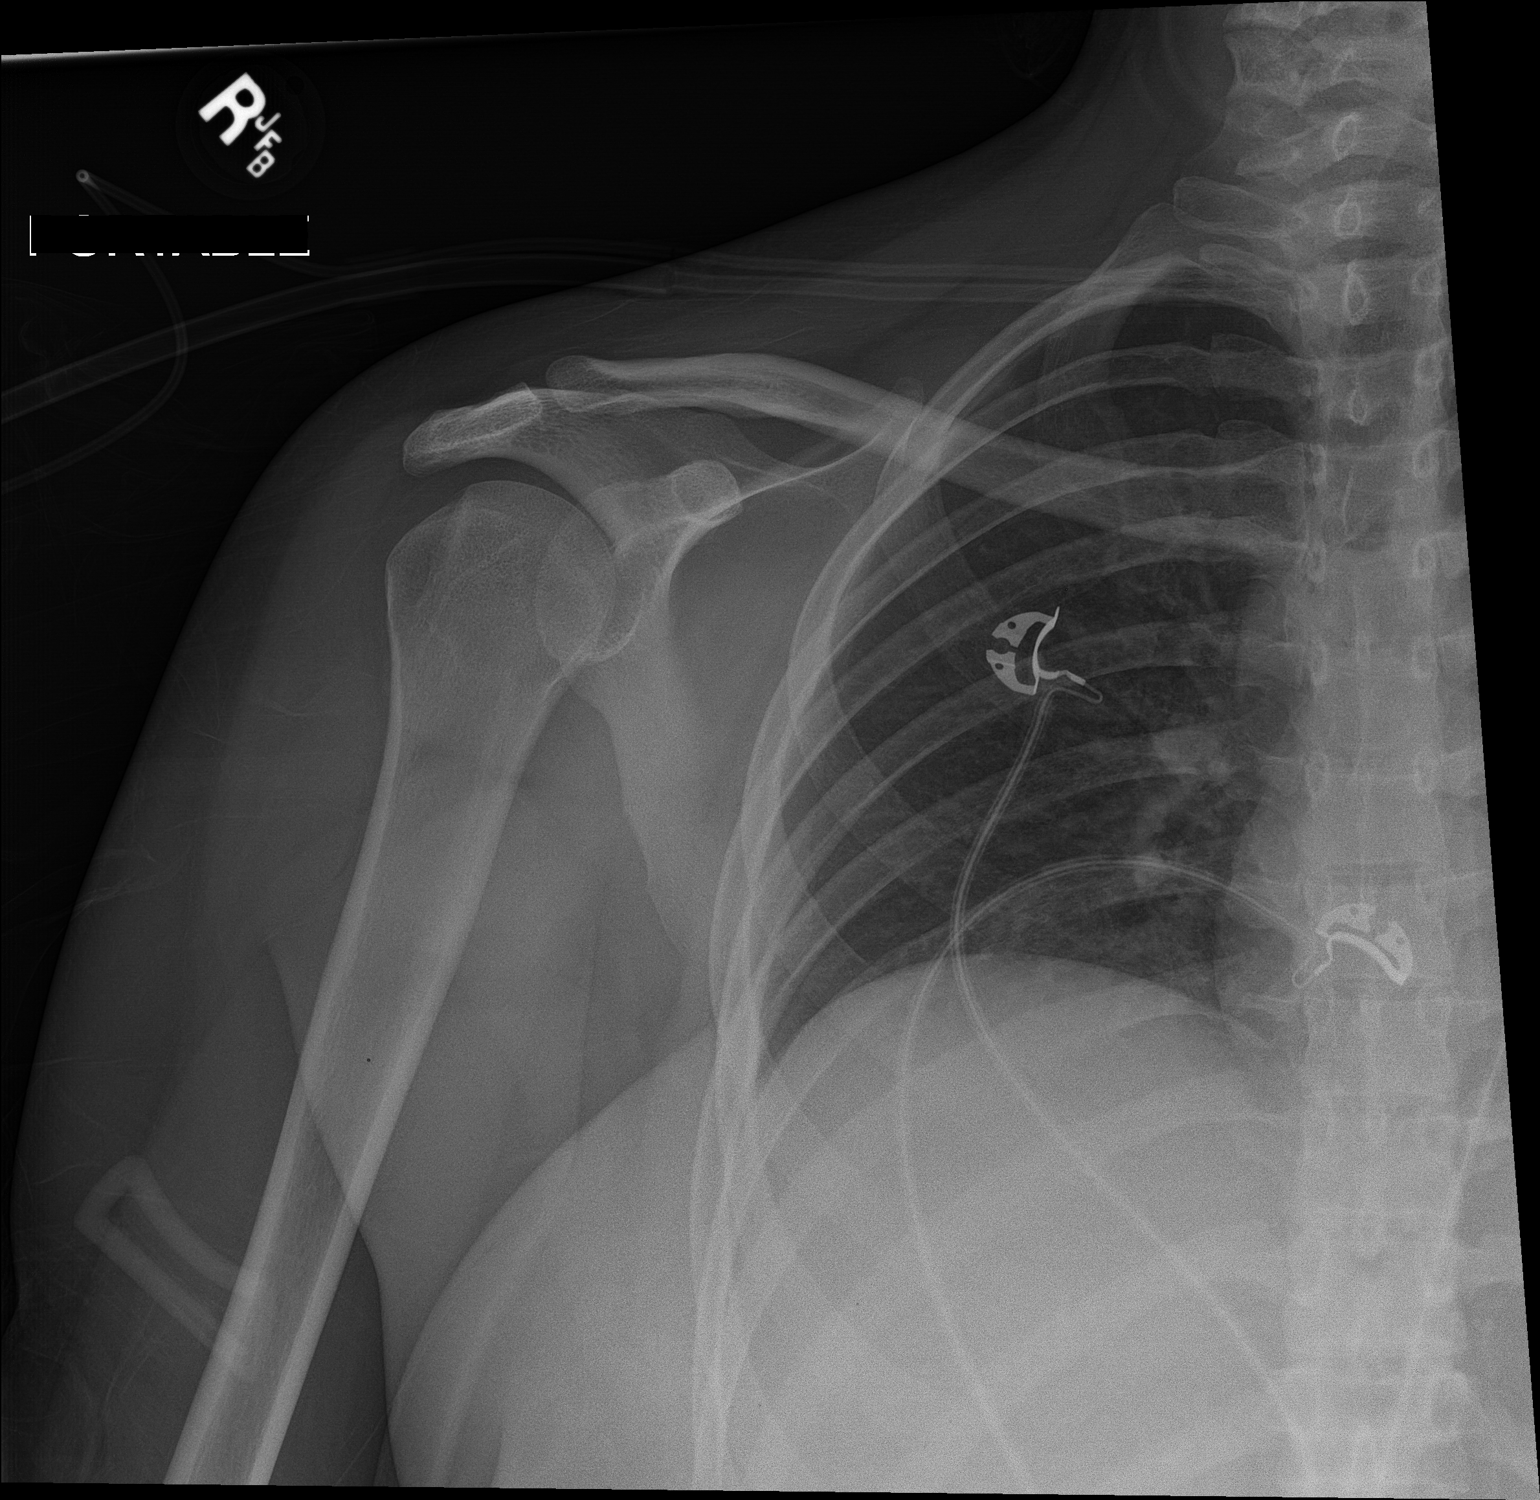

[1 of 1 positions shown; findings below may reference images not displayed]

FINDINGS: Single view shows relocation of the humeral head in the glenoid.
Apparent Hill-Sachs fracture of the posterolateral humeral head, age
indeterminate. No visible Cornelius injury. AC joint appears normal.
IMPRESSION: Relocated. Hill-Sachs deformity the humeral head, age indeterminate.

## 2018-04-05 ENCOUNTER — Emergency Department (HOSPITAL_BASED_OUTPATIENT_CLINIC_OR_DEPARTMENT_OTHER): Payer: 59

## 2018-04-05 ENCOUNTER — Other Ambulatory Visit: Payer: Self-pay

## 2018-04-05 ENCOUNTER — Emergency Department (HOSPITAL_BASED_OUTPATIENT_CLINIC_OR_DEPARTMENT_OTHER)
Admission: EM | Admit: 2018-04-05 | Discharge: 2018-04-05 | Disposition: A | Discharge status: Home / Self Care | Payer: 59 | Attending: Emergency Medicine | Admitting: Emergency Medicine

## 2018-04-05 ENCOUNTER — Encounter (HOSPITAL_BASED_OUTPATIENT_CLINIC_OR_DEPARTMENT_OTHER): Payer: Self-pay | Admitting: *Deleted

## 2018-04-05 DIAGNOSIS — Z79899 Other long term (current) drug therapy: Secondary | ICD-10-CM | POA: Insufficient documentation

## 2018-04-05 DIAGNOSIS — F17210 Nicotine dependence, cigarettes, uncomplicated: Secondary | ICD-10-CM | POA: Insufficient documentation

## 2018-04-05 DIAGNOSIS — D509 Iron deficiency anemia, unspecified: Secondary | ICD-10-CM | POA: Insufficient documentation

## 2018-04-05 DIAGNOSIS — R0602 Shortness of breath: Secondary | ICD-10-CM | POA: Diagnosis present

## 2018-04-05 LAB — CBC WITH DIFFERENTIAL/PLATELET
BASOS ABS: 0 10*3/uL (ref 0.0–0.1)
BASOS PCT: 0 %
Eosinophils Absolute: 0.1 10*3/uL (ref 0.0–0.7)
Eosinophils Relative: 1 %
HCT: 27.5 % — ABNORMAL LOW (ref 36.0–46.0)
Hemoglobin: 8.4 g/dL — ABNORMAL LOW (ref 12.0–15.0)
Lymphocytes Relative: 22 %
Lymphs Abs: 1.8 10*3/uL (ref 0.7–4.0)
MCH: 19.3 pg — AB (ref 26.0–34.0)
MCHC: 30.5 g/dL (ref 30.0–36.0)
MCV: 63.2 fL — ABNORMAL LOW (ref 78.0–100.0)
MONO ABS: 0.2 10*3/uL (ref 0.1–1.0)
Monocytes Relative: 3 %
NEUTROS PCT: 74 %
Neutro Abs: 5.9 10*3/uL (ref 1.7–7.7)
PLATELETS: 442 10*3/uL — AB (ref 150–400)
RBC: 4.35 MIL/uL (ref 3.87–5.11)
RDW: 18.3 % — ABNORMAL HIGH (ref 11.5–15.5)
WBC: 8 10*3/uL (ref 4.0–10.5)

## 2018-04-05 LAB — COMPREHENSIVE METABOLIC PANEL
ALK PHOS: 51 U/L (ref 38–126)
ALT: 13 U/L — AB (ref 14–54)
AST: 16 U/L (ref 15–41)
Albumin: 4.3 g/dL (ref 3.5–5.0)
Anion gap: 10 (ref 5–15)
BILIRUBIN TOTAL: 0.4 mg/dL (ref 0.3–1.2)
BUN: 7 mg/dL (ref 6–20)
CALCIUM: 9.1 mg/dL (ref 8.9–10.3)
CO2: 23 mmol/L (ref 22–32)
CREATININE: 0.65 mg/dL (ref 0.44–1.00)
Chloride: 106 mmol/L (ref 101–111)
Glucose, Bld: 85 mg/dL (ref 65–99)
Potassium: 3.6 mmol/L (ref 3.5–5.1)
Sodium: 139 mmol/L (ref 135–145)
TOTAL PROTEIN: 7.7 g/dL (ref 6.5–8.1)

## 2018-04-05 LAB — URINALYSIS, ROUTINE W REFLEX MICROSCOPIC
BILIRUBIN URINE: NEGATIVE
GLUCOSE, UA: NEGATIVE mg/dL
HGB URINE DIPSTICK: NEGATIVE
Ketones, ur: NEGATIVE mg/dL
Leukocytes, UA: NEGATIVE
Nitrite: NEGATIVE
PH: 7 (ref 5.0–8.0)
Protein, ur: NEGATIVE mg/dL

## 2018-04-05 LAB — PREGNANCY, URINE: Preg Test, Ur: NEGATIVE

## 2018-04-05 LAB — D-DIMER, QUANTITATIVE (NOT AT ARMC): D DIMER QUANT: 0.27 ug{FEU}/mL (ref 0.00–0.50)

## 2018-04-05 MED ORDER — SODIUM CHLORIDE 0.9 % IV BOLUS
500.0000 mL | Freq: Once | INTRAVENOUS | Status: DC
Start: 2018-04-05 — End: 2018-04-05

## 2018-04-05 MED ORDER — SODIUM CHLORIDE 0.9 % IV BOLUS
1000.0000 mL | Freq: Once | INTRAVENOUS | Status: AC
  Administered 2018-04-05: 1000 mL via INTRAVENOUS

## 2018-04-05 NOTE — ED Triage Notes (Signed)
SOB and near syncope today. She is in no distress. Speaking in complete sentences with no difficulty. States she had diarrhea x 3 this am. Called EMS but refused transport.

## 2018-04-05 NOTE — ED Notes (Signed)
Assumed care of patient from Maralyn SagoSarah, RN. Pt resting quietly. No distress. No complaints. Family at side. Awaiting results.

## 2018-04-05 NOTE — Discharge Instructions (Signed)
Follow up with your primary care provider for further evaluation

## 2018-04-05 NOTE — ED Provider Notes (Signed)
[facility] EMERGENCY DEPARTMENT Provider Note   CSN: [id number] Arrival date & time: 04/05/18  1301     History   Chief Complaint Chief Complaint  Patient presents with  . Shortness of Breath    HPI Jacqueline Wagner is a 27 y.o. female with a past medical history of iron deficiency anemia, who presents to ED for evaluation of shortness of breath since waking up this morning.  She also states she has had 2-3 episodes of feeling like her heart is racing, chest discomfort.  She reports that all the symptoms began today.  She has had 3 episodes of diarrhea this morning as well.  She was seen and evaluated by EMS and was told she had possible orthostatic vital signs.  She does endorse recent stressors as she is getting married next week and has had a few things to do with regarding that.  She continually states that "I just do not feel well."  She denies any hemoptysis, recent surgeries, recent prolonged travel, OCP use, wheezing, vomiting, abdominal pain or fevers, hematochezia, melena, hematemesis, abnormal vaginal bleeding. Of note, patient has been on liraglutide for 2 years for weight loss.  HPI  Past Medical History:  Diagnosis Date  . UTI (urinary tract infection)     Patient Active Problem List   Diagnosis Date Noted  . Anterior dislocation of right shoulder 05/20/2017    History reviewed. No pertinent surgical history.   OB History   None      Home Medications    Prior to Admission medications   Medication Sig Start Date End Date Taking? Authorizing Provider  ciprofloxacin (CIPRO) 500 MG tablet Take 500 mg by mouth 2 (two) times daily.    [provider]  ciprofloxacin (CIPRO) 500 MG tablet Take 0.5 tablets (250 mg total) by mouth 2 (two) times daily. 09/24/12   Emilia BeckSzekalski, Kaitlyn, PA-C  DiphenhydrAMINE HCl (BENADRYL ALLERGY PO) Take 1 tablet by mouth daily as needed. Patient used this medication a rash.    [provider]    HYDROcodone-acetaminophen (NORCO) 5-325 MG tablet Take 1 tablet by mouth every 4 (four) hours as needed for severe pain. Patient not taking: Reported on 06/10/2017 05/15/17   Arthor CaptainHarris, Abigail, PA-C  ibuprofen (ADVIL,MOTRIN) 200 MG tablet Take 800 mg by mouth every 6 (six) hours as needed. Patient used this medication for her tooth pain.    [provider]  meloxicam (MOBIC) 15 MG tablet Take 1 tablet (15 mg total) by mouth daily. Take 1 daily with food. Patient not taking: Reported on 06/10/2017 05/15/17   Arthor CaptainHarris, Abigail, PA-C  nitrofurantoin, macrocrystal-monohydrate, (MACROBID) 100 MG capsule Take 1 capsule (100 mg total) by mouth 2 (two) times daily. X 3 days 03/07/15   Nicanor AlconPalumbo, April, MD  phenazopyridine (PYRIDIUM) 200 MG tablet Take 1 tablet (200 mg total) by mouth 3 (three) times daily. 03/07/15   Palumbo, April, MD  PRESCRIPTION MEDICATION     [provider]  Pseudoeph-Doxylamine-DM-APAP (NYQUIL PO) Take 2 capsules by mouth daily as needed. Patient used this medication for her cold symptoms.    [provider]  traMADol (ULTRAM) 50 MG tablet Take 1-2 tablets (50-100 mg total) by mouth every 6 (six) hours as needed for pain. 09/08/12   Jones SkeneBonk, John-Adam, MD    Family History No family history on file.  Social History Social History   Tobacco Use  . Smoking status: Current Every Day Smoker    Packs/day: 0.50    Types: Cigarettes  .  Smokeless tobacco: Never Used  Substance Use Topics  . Alcohol use: No  . Drug use: No     Allergies   Patient has no known allergies.   Review of Systems Review of Systems  Constitutional: Positive for fatigue. Negative for appetite change, chills and fever.  HENT: Negative for ear pain, rhinorrhea, sneezing and sore throat.   Eyes: Negative for photophobia and visual disturbance.  Respiratory: Positive for shortness of breath. Negative for cough, chest tightness and wheezing.   Cardiovascular: Positive for palpitations.  Negative for chest pain.  Gastrointestinal: Positive for diarrhea. Negative for abdominal pain, blood in stool, constipation, nausea and vomiting.  Genitourinary: Negative for dysuria, hematuria and urgency.  Musculoskeletal: Negative for myalgias.  Skin: Negative for rash.  Neurological: Positive for light-headedness. Negative for dizziness and weakness.     Physical Exam Updated Vital Signs BP 102/61 (BP Location: Left Arm)   Pulse 80   Temp 98.3 F (36.8 C) (Oral)   Resp 18   Ht 5\' 4"  (1.626 m)   Wt 77.1 kg (170 lb)   LMP 03/29/2018   SpO2 100%   BMI 29.18 kg/m   Physical Exam  Constitutional: She appears well-developed and well-nourished. No distress.  Nontoxic-appearing and in no acute distress.  HENT:  Head: Normocephalic and atraumatic.  Nose: Nose normal.  Eyes: Conjunctivae and EOM are normal. Left eye exhibits no discharge. No scleral icterus.  Neck: Normal range of motion. Neck supple.  Cardiovascular: Normal rate, regular rhythm, normal heart sounds and intact distal pulses. Exam reveals no gallop and no friction rub.  No murmur heard. Pulmonary/Chest: Effort normal and breath sounds normal. No respiratory distress.  Abdominal: Soft. Bowel sounds are normal. She exhibits no distension. There is no tenderness. There is no guarding.  Musculoskeletal: Normal range of motion. She exhibits no edema.  No lower extremity edema, erythema or calf tenderness bilaterally.  Neurological: She is alert. She exhibits normal muscle tone. Coordination normal.  Skin: Skin is warm and dry. No rash noted.  Psychiatric: She has a normal mood and affect.  Nursing note and vitals reviewed.    ED Treatments / Results  Labs (all labs ordered are listed, but only abnormal results are displayed) Labs Reviewed  URINALYSIS, ROUTINE W REFLEX MICROSCOPIC - Abnormal; Notable for the following components:      Result Value   Specific Gravity, Urine <1.005 (*)    All other components  within normal limits  COMPREHENSIVE METABOLIC PANEL - Abnormal; Notable for the following components:   ALT 13 (*)    All other components within normal limits  CBC WITH DIFFERENTIAL/PLATELET - Abnormal; Notable for the following components:   Hemoglobin 8.4 (*)    HCT 27.5 (*)    MCV 63.2 (*)    MCH 19.3 (*)    RDW 18.3 (*)    Platelets 442 (*)    All other components within normal limits  PREGNANCY, URINE  D-DIMER, QUANTITATIVE (NOT AT [facility])    EKG None  Radiology Dg Chest 2 View  Result Date: 04/05/2018 CLINICAL DATA:  Shortness of breath EXAM: CHEST - 2 VIEW COMPARISON:  None. FINDINGS: The heart size and mediastinal contours are within normal limits. Both lungs are clear. The visualized skeletal structures are unremarkable. IMPRESSION: No active cardiopulmonary disease. Electronically Signed   By: Jasmine Pang M.D.   On: 04/05/2018 15:14    Procedures Procedures (including critical care time)  Medications Ordered in ED Medications  sodium chloride 0.9 % bolus  1,000 mL (1,000 mLs Intravenous New Bag/Given 04/05/18 1443)     Initial Impression / Assessment and Plan / ED Course  I have reviewed the triage vital signs and the nursing notes.  Pertinent labs & imaging results that were available during my care of the patient were reviewed by me and considered in my medical decision making (see chart for details).      Patient, with a past medical history of IDA, presents to ED for evaluation of shortness of breath, 3 episodes of diarrhea, feelings of palpitations and chest discomfort.  She was seen and evaluated by EMS and was told she had positive orthostatic vital signs.  She believes that she may also be stressed about her upcoming wedding next week.  She does report slightly heavier menstrual bleeding associated with her menstrual cycle which finished 3 days ago.  She denies any blood in stool, vomit, ongoing vaginal bleeding, recent surgeries, recent prolonged travel or  OCP use.  On physical exam she is overall well-appearing.  She does not appear dehydrated.  She is not tachycardic or tachypneic.  She is afebrile.  She is satting at 100% on room air.  Lab work including BMP, d-dimer, urinalysis unremarkable.  Chest x-ray unremarkable.  Urine pregnancy negative.  Hemoglobin and hematocrit at 8.4 and 27.5.  Patient does have a history of iron deficiency anemia but does not take any iron pills because she states that they make her very nauseous.  She has not taking them for several years.  This is most likely the cause of her hemoglobin and hematocrit today.  However, this does appear similar to prior readings at 8-9.  No signs of cardiac or pulmonary cause of symptoms.  Patient reports significant and near resolution of her symptoms with IV fluids and supportive measures given here.  She was able to tolerate p.o. intake and ambulate without difficulty.  Advised to follow-up with her PCP for further evaluation and to restart her iron supplementation.  Advised to return for any severe worsening symptoms.  Patient is agreeable to this plan. She does not feel as though she needs to be admitted for further workup.  Portions of this note were generated with Scientist, clinical (histocompatibility and immunogenetics). Dictation errors may occur despite best attempts at proofreading.   Final Clinical Impressions(s) / ED Diagnoses   Final diagnoses:  Iron deficiency anemia, unspecified iron deficiency anemia type    ED Discharge Orders    None       Dietrich Pates, PA-C 04/05/18 1538    Benjiman Core, MD 04/05/18 [phone]

## 2018-05-30 ENCOUNTER — Encounter (HOSPITAL_BASED_OUTPATIENT_CLINIC_OR_DEPARTMENT_OTHER): Payer: Self-pay | Admitting: Emergency Medicine

## 2018-05-30 ENCOUNTER — Emergency Department (HOSPITAL_BASED_OUTPATIENT_CLINIC_OR_DEPARTMENT_OTHER)
Admission: EM | Admit: 2018-05-30 | Discharge: 2018-05-30 | Disposition: A | Discharge status: Home / Self Care | Payer: 59 | Attending: Emergency Medicine | Admitting: Emergency Medicine

## 2018-05-30 ENCOUNTER — Other Ambulatory Visit: Payer: Self-pay

## 2018-05-30 DIAGNOSIS — R21 Rash and other nonspecific skin eruption: Secondary | ICD-10-CM | POA: Insufficient documentation

## 2018-05-30 DIAGNOSIS — Z87891 Personal history of nicotine dependence: Secondary | ICD-10-CM | POA: Insufficient documentation

## 2018-05-30 MED ORDER — TRIAMCINOLONE ACETONIDE 0.1 % EX CREA: 1.0000 "application " | TOPICAL_CREAM | Freq: Two times a day (BID) | CUTANEOUS | 0 refills | Status: DC

## 2018-05-30 MED ORDER — PREDNISONE 20 MG PO TABS: ORAL_TABLET | ORAL | 0 refills | Status: DC

## 2018-05-30 MED ORDER — HYDROXYZINE HCL 25 MG PO TABS: 25.0000 mg | ORAL_TABLET | Freq: Four times a day (QID) | ORAL | 0 refills | Status: DC | PRN

## 2018-05-30 NOTE — ED Triage Notes (Signed)
Red itchy rash to stomach, L upper leg and R eye x 2 weeks.

## 2018-05-30 NOTE — ED Provider Notes (Signed)
[facility] EMERGENCY DEPARTMENT Provider Note   CSN: [id number] Arrival date & time: 05/30/18  0756     History   Chief Complaint Chief Complaint  Patient presents with  . Rash    HPI Jacqueline Wagner is a 27 y.o. female.  Patient is a 27 year old female who presents with a rash.  She describes an itchy rash on her abdomen that started about 2 weeks ago and is spreading down into her legs.  She denies any blistering.  No new exposures other than she did recently changed detergents.  No facial swelling or shortness of breath.  She has a little bit of the rash starting over her right eye.  No involvement of the eye or vision changes.  No drainage from the lesions.  No fevers.  No URI symptoms.  She had a history of hives in the past.  She is been using cortisone cream without improvement in symptoms.     Past Medical History:  Diagnosis Date  . UTI (urinary tract infection)     Patient Active Problem List   Diagnosis Date Noted  . Anterior dislocation of right shoulder 05/20/2017    History reviewed. No pertinent surgical history.   OB History   None      Home Medications    Prior to Admission medications   Medication Sig Start Date End Date Taking? Authorizing Provider  ciprofloxacin (CIPRO) 500 MG tablet Take 500 mg by mouth 2 (two) times daily.    [provider]  ciprofloxacin (CIPRO) 500 MG tablet Take 0.5 tablets (250 mg total) by mouth 2 (two) times daily. 09/24/12   Emilia BeckSzekalski, Kaitlyn, PA-C  DiphenhydrAMINE HCl (BENADRYL ALLERGY PO) Take 1 tablet by mouth daily as needed. Patient used this medication a rash.    [provider]  HYDROcodone-acetaminophen (NORCO) 5-325 MG tablet Take 1 tablet by mouth every 4 (four) hours as needed for severe pain. Patient not taking: Reported on 06/10/2017 05/15/17   Arthor CaptainHarris, Abigail, PA-C  hydrOXYzine (ATARAX/VISTARIL) 25 MG tablet Take 1 tablet (25 mg total) by mouth every 6 (six) hours as needed for  itching. 05/30/18   Rolan BuccoBelfi, Deriana Vanderhoef, MD  ibuprofen (ADVIL,MOTRIN) 200 MG tablet Take 800 mg by mouth every 6 (six) hours as needed. Patient used this medication for her tooth pain.    [provider]  meloxicam (MOBIC) 15 MG tablet Take 1 tablet (15 mg total) by mouth daily. Take 1 daily with food. Patient not taking: Reported on 06/10/2017 05/15/17   Arthor CaptainHarris, Abigail, PA-C  nitrofurantoin, macrocrystal-monohydrate, (MACROBID) 100 MG capsule Take 1 capsule (100 mg total) by mouth 2 (two) times daily. X 3 days 03/07/15   Nicanor AlconPalumbo, April, MD  phenazopyridine (PYRIDIUM) 200 MG tablet Take 1 tablet (200 mg total) by mouth 3 (three) times daily. 03/07/15   Palumbo, April, MD  predniSONE (DELTASONE) 20 MG tablet 3 tabs po day one, then 2 po daily x 4 days 05/30/18   Rolan BuccoBelfi, Amri Lien, MD  PRESCRIPTION MEDICATION     [provider]  Pseudoeph-Doxylamine-DM-APAP (NYQUIL PO) Take 2 capsules by mouth daily as needed. Patient used this medication for her cold symptoms.    [provider]  traMADol (ULTRAM) 50 MG tablet Take 1-2 tablets (50-100 mg total) by mouth every 6 (six) hours as needed for pain. 09/08/12   Bonk, John-Adam, MD  triamcinolone cream (KENALOG) 0.1 % Apply 1 application topically 2 (two) times daily. 05/30/18   Rolan BuccoBelfi, Thailyn Khalid, MD    Family History No  family history on file.  Social History Social History   Tobacco Use  . Smoking status: Former Smoker    Packs/day: 0.50    Types: Cigarettes  . Smokeless tobacco: Never Used  Substance Use Topics  . Alcohol use: Yes  . Drug use: No     Allergies   Patient has no known allergies.   Review of Systems Review of Systems  Constitutional: Negative for chills, diaphoresis, fatigue and fever.  HENT: Negative for congestion, rhinorrhea and sneezing.   Eyes: Negative.   Respiratory: Negative for cough, chest tightness and shortness of breath.   Cardiovascular: Negative for chest pain and leg swelling.    Gastrointestinal: Negative for abdominal pain, blood in stool, diarrhea, nausea and vomiting.  Genitourinary: Negative for difficulty urinating, flank pain, frequency and hematuria.  Musculoskeletal: Negative for arthralgias and back pain.  Skin: Positive for rash.  Neurological: Negative for dizziness, speech difficulty, weakness, numbness and headaches.     Physical Exam Updated Vital Signs BP 111/77 (BP Location: Left Arm)   Pulse 89   Temp 98.2 F (36.8 C) (Oral)   Resp 18   Ht 5\' 4"  (1.626 m)   Wt 77.1 kg (170 lb)   LMP 05/20/2018   SpO2 100%   BMI 29.18 kg/m   Physical Exam  Constitutional: She is oriented to person, place, and time. She appears well-developed and well-nourished.  HENT:  Head: Normocephalic and atraumatic.  Eyes: Pupils are equal, round, and reactive to light.  Neck: Normal range of motion. Neck supple.  Cardiovascular: Normal rate, regular rhythm and normal heart sounds.  Pulmonary/Chest: Effort normal and breath sounds normal. No respiratory distress. She has no wheezes. She has no rales. She exhibits no tenderness.  Abdominal: Soft. Bowel sounds are normal. There is no tenderness. There is no rebound and no guarding.  Musculoskeletal: Normal range of motion. She exhibits no edema.  Lymphadenopathy:    She has no cervical adenopathy.  Neurological: She is alert and oriented to person, place, and time.  Skin: Skin is warm and dry. No rash noted.  Patient has an erythematous rash with patches to her lower abdomen and some small patches to her legs.  It is blanching.  There is no petechiae or purpura.  No vesicles.  Psychiatric: She has a normal mood and affect.     ED Treatments / Results  Labs (all labs ordered are listed, but only abnormal results are displayed) Labs Reviewed - No data to display  EKG None  Radiology No results found.  Procedures Procedures (including critical care time)  Medications Ordered in ED Medications - No  data to display   Initial Impression / Assessment and Plan / ED Course  I have reviewed the triage vital signs and the nursing notes.  Pertinent labs & imaging results that were available during my care of the patient were reviewed by me and considered in my medical decision making (see chart for details).     Patient presents with a rash.  It looks to be allergic in nature.  I do not see any characteristic features of ringworm or poison ivy.  There is no systemic involvement.  No shortness of breath or facial swelling.  No angioedema.  She was discharged home in good condition.  She was try using a different detergent.  She was given prescriptions for triamcinolone cream although advised not to use it on her face or genital areas.  She was also started on a 5-day course of  prednisone and given a prescription for Atarax.  She was given a referral to follow-up with a dermatologist if her symptoms are not improving.  Return precautions were given.  Final Clinical Impressions(s) / ED Diagnoses   Final diagnoses:  Rash    ED Discharge Orders        Ordered    triamcinolone cream (KENALOG) 0.1 %  2 times daily     05/30/18 0823    predniSONE (DELTASONE) 20 MG tablet     05/30/18 0823    hydrOXYzine (ATARAX/VISTARIL) 25 MG tablet  Every 6 hours PRN     05/30/18 1610       Rolan Bucco, MD 05/30/18 9604

## 2018-05-30 NOTE — ED Notes (Signed)
ED Provider at bedside. 

## 2018-09-26 DIAGNOSIS — R350 Frequency of micturition: Secondary | ICD-10-CM | POA: Diagnosis not present

## 2018-09-26 DIAGNOSIS — D509 Iron deficiency anemia, unspecified: Secondary | ICD-10-CM | POA: Diagnosis not present

## 2018-09-26 DIAGNOSIS — R635 Abnormal weight gain: Secondary | ICD-10-CM | POA: Diagnosis not present

## 2018-09-26 DIAGNOSIS — M545 Low back pain: Secondary | ICD-10-CM | POA: Diagnosis not present

## 2018-10-06 DIAGNOSIS — D509 Iron deficiency anemia, unspecified: Secondary | ICD-10-CM | POA: Diagnosis not present

## 2018-10-06 DIAGNOSIS — Z124 Encounter for screening for malignant neoplasm of cervix: Secondary | ICD-10-CM | POA: Diagnosis not present

## 2018-10-06 DIAGNOSIS — Z0000 Encounter for general adult medical examination without abnormal findings: Secondary | ICD-10-CM | POA: Diagnosis not present

## 2018-10-06 DIAGNOSIS — Z13220 Encounter for screening for lipoid disorders: Secondary | ICD-10-CM | POA: Diagnosis not present

## 2018-10-06 DIAGNOSIS — E162 Hypoglycemia, unspecified: Secondary | ICD-10-CM | POA: Diagnosis not present

## 2018-10-12 DIAGNOSIS — Z6835 Body mass index (BMI) 35.0-35.9, adult: Secondary | ICD-10-CM | POA: Diagnosis not present

## 2018-10-12 DIAGNOSIS — E875 Hyperkalemia: Secondary | ICD-10-CM | POA: Diagnosis not present

## 2018-10-12 DIAGNOSIS — D509 Iron deficiency anemia, unspecified: Secondary | ICD-10-CM | POA: Diagnosis not present

## 2018-10-20 DIAGNOSIS — Z87891 Personal history of nicotine dependence: Secondary | ICD-10-CM | POA: Diagnosis not present

## 2018-10-20 DIAGNOSIS — D509 Iron deficiency anemia, unspecified: Secondary | ICD-10-CM | POA: Diagnosis not present

## 2018-10-20 DIAGNOSIS — K219 Gastro-esophageal reflux disease without esophagitis: Secondary | ICD-10-CM | POA: Diagnosis not present

## 2018-10-20 DIAGNOSIS — D508 Other iron deficiency anemias: Secondary | ICD-10-CM | POA: Diagnosis not present

## 2018-12-28 DIAGNOSIS — R0989 Other specified symptoms and signs involving the circulatory and respiratory systems: Secondary | ICD-10-CM | POA: Diagnosis not present

## 2018-12-28 DIAGNOSIS — M545 Low back pain, unspecified: Secondary | ICD-10-CM | POA: Insufficient documentation

## 2018-12-28 DIAGNOSIS — M549 Dorsalgia, unspecified: Secondary | ICD-10-CM | POA: Diagnosis not present

## 2018-12-28 DIAGNOSIS — J40 Bronchitis, not specified as acute or chronic: Secondary | ICD-10-CM | POA: Diagnosis not present

## 2019-02-23 DIAGNOSIS — D649 Anemia, unspecified: Secondary | ICD-10-CM | POA: Diagnosis not present

## 2019-02-23 DIAGNOSIS — E668 Other obesity: Secondary | ICD-10-CM | POA: Diagnosis not present

## 2019-02-23 DIAGNOSIS — Z6835 Body mass index (BMI) 35.0-35.9, adult: Secondary | ICD-10-CM | POA: Diagnosis not present

## 2019-02-23 DIAGNOSIS — R109 Unspecified abdominal pain: Secondary | ICD-10-CM | POA: Diagnosis not present

## 2019-03-21 DIAGNOSIS — Z331 Pregnant state, incidental: Secondary | ICD-10-CM | POA: Diagnosis not present

## 2019-03-21 DIAGNOSIS — J069 Acute upper respiratory infection, unspecified: Secondary | ICD-10-CM | POA: Diagnosis not present

## 2019-03-30 ENCOUNTER — Other Ambulatory Visit: Payer: Self-pay | Admitting: Hematology

## 2019-03-30 DIAGNOSIS — N920 Excessive and frequent menstruation with regular cycle: Secondary | ICD-10-CM | POA: Insufficient documentation

## 2019-03-30 DIAGNOSIS — N911 Secondary amenorrhea: Secondary | ICD-10-CM | POA: Diagnosis not present

## 2019-03-30 DIAGNOSIS — D509 Iron deficiency anemia, unspecified: Secondary | ICD-10-CM | POA: Insufficient documentation

## 2019-03-30 DIAGNOSIS — E669 Obesity, unspecified: Secondary | ICD-10-CM | POA: Insufficient documentation

## 2019-03-30 DIAGNOSIS — D649 Anemia, unspecified: Secondary | ICD-10-CM | POA: Diagnosis not present

## 2019-03-30 DIAGNOSIS — D500 Iron deficiency anemia secondary to blood loss (chronic): Secondary | ICD-10-CM

## 2019-03-30 DIAGNOSIS — R0981 Nasal congestion: Secondary | ICD-10-CM | POA: Diagnosis not present

## 2019-03-30 NOTE — Progress Notes (Signed)
[facility] CONSULT NOTE  Patient Care Team: Martha Clan, MD as PCP - General (Internal Medicine) Martha Clan, MD (Internal Medicine)  HEME/ONC OVERVIEW: 1. Iron deficiency anemia secondary to menorrhagia -Previously seen by hematology at [facility] in [place]  PERTINENT NON-HEM/ONC PROBLEMS: 1. Menorrhagia  2. Pregnancy, currently 9 weeks   ASSESSMENT & PLAN:   Iron deficiency anemia -Likely due to chronic blood loss from menorrhagia and decreased PO intake  -I reviewed the patient's records in detail, including external hematology clinic notes and lab studies.  In summary, patient has had persistent microcytic anemia with Hgb between 8 and 9 dating back to 2019.  She has history of menorrhagia, which was felt to be the cause of iron deficiency anemia by her hematologist at [facility] in [place].  An order for IV iron was documented in 2019 but patient never received it. Patient reports being tested for thalassemia in the past, but no results are available.  She was referred to hematology [facility] for further management of IDA. Of note, patient is currently [redacted] weeks pregnant during her 1st pregnancy.  -I personally reviewed the patient's peripheral blood smear, which showed microcytic, hypochromic RBC's with some elliptocytes, consistent with IDA; no schistocytosis  -Hgb 8.3 today, stable  -Given the persistent anemia, somewhat disproportionate to the degree of menorrhagia as well as its chronicity, I have ordered hemoglobin electrophoresis and hemoglobin gene analysis to rule out thalassemias  -I discussed with the patient various options of managing iron deficiency anemia, including oral iron supplement, increasing dietary iron intake, and if no improvement, IV iron infusion. She has had some difficulty tolerating iron tablets in the past, including nausea, and therefore has not been taking iron supplement. She does not regularly eat red meat because  "it doesn't sit well", but has had hamburgers without significant GI upset.  -Finally, we discussed some of the risks and benefits of intravenous iron infusions.  -She has had chronic anemia and her Hgb is currently at baseline, and she does not any change in her symptoms, such as fatigue  -While IV iron may provide an increase in Hgb quickly, there are reports of increased risk of adverse outcomes from IV iron during pregnancy, including anaphylactic reaction; furthermore, the hematology clinic does not have fetal monitoring during IV iron infusion, and therefore cannot safely monitor the fetus for any changes during the infusion -After lengthy discussions of some of the benefits and risks, patient expressed desire to try oral iron supplement and to increase dietary intake of iron before considering IV infusion -In addition, she would like to continue follow-up with her obstetrician for the monitoring of anemia; I encouraged the patient to discuss with her OB regarding IV iron infusion there, where they can monitor the fetus much more comprehensively; patient agreed to the plan   No orders of the defined types were placed in this encounter.  A total of more than 45 minutes were spent face-to-face with the patient during this encounter and over half of that time was spent on counseling and coordination of care as outlined above.    All questions were answered. The patient knows to call the clinic with any problems, questions or concerns.  Return as needed. Patient will continue follow-up with her OB and PCP.   Arthur Holms, MD 04/06/2019 1:49 PM   CHIEF COMPLAINTS/PURPOSE OF CONSULTATION:  Microcytic anemia  HISTORY OF PRESENTING ILLNESS:  Jacqueline Wagner 28 y.o. female is here because of chronic microcytic  anemia.  Patient reports that she has had chronic anemia with hemoglobin in the mid eights since high school.  She was seen by hematology at [facility] in [place] for iron deficiency  anemia in the past, and IV iron was recommended, but patient never received it.  She has tried different oral iron supplement tablets, but cannot tolerate any of them due to nausea.  She has a history of somewhat heavy menstrual cycle, lasting 5 days, heavy during the second to the third day.  In addition, she does not eat red meat due to reported GI upset (except hamburgers).  She is currently [redacted] weeks pregnant, and followed by Dr. Velvet Bathe at [facility].  She denies any symptoms of abnormal bleeding, such as hematochezia, melena, hematuria, hematemesis or hemoptysis.  There is no family history of thalassemia.  MEDICAL HISTORY:  Past Medical History:  Diagnosis Date  . UTI (urinary tract infection)     SURGICAL HISTORY: No past surgical history on file.  SOCIAL HISTORY: Social History   Socioeconomic History  . Marital status: Single    Spouse name: Not on file  . Number of children: Not on file  . Years of education: Not on file  . Highest education level: Not on file  Occupational History  . Not on file  Social Needs  . Financial resource strain: Not on file  . Food insecurity:    Worry: Not on file    Inability: Not on file  . Transportation needs:    Medical: Not on file    Non-medical: Not on file  Tobacco Use  . Smoking status: Former Smoker    Packs/day: 0.50    Types: Cigarettes  . Smokeless tobacco: Never Used  Substance and Sexual Activity  . Alcohol use: Yes  . Drug use: No  . Sexual activity: Yes    Birth control/protection: Condom  Lifestyle  . Physical activity:    Days per week: Not on file    Minutes per session: Not on file  . Stress: Not on file  Relationships  . Social connections:    Talks on phone: Not on file    Gets together: Not on file    Attends religious service: Not on file    Active member of club or organization: Not on file    Attends meetings of clubs or organizations: Not on file    Relationship status: Not on  file  . Intimate partner violence:    Fear of current or ex partner: Not on file    Emotionally abused: Not on file    Physically abused: Not on file    Forced sexual activity: Not on file  Other Topics Concern  . Not on file  Social History Narrative  . Not on file    FAMILY HISTORY: No family history on file.  ALLERGIES:  is allergic to other.  MEDICATIONS:  Current Outpatient Medications  Medication Sig Dispense Refill  . ciprofloxacin (CIPRO) 500 MG tablet Take 500 mg by mouth 2 (two) times daily.    . ciprofloxacin (CIPRO) 500 MG tablet Take 0.5 tablets (250 mg total) by mouth 2 (two) times daily. (Patient not taking: Reported on 04/06/2019) 6 tablet 0  . cyclobenzaprine (FLEXERIL) 10 MG tablet cyclobenzaprine 10 mg tablet    . DiphenhydrAMINE HCl (BENADRYL ALLERGY PO) Take 1 tablet by mouth daily as needed. Patient used this medication a rash.    Marland Kitchen HYDROcodone-acetaminophen (NORCO) 5-325 MG tablet Take 1 tablet by  mouth every 4 (four) hours as needed for severe pain. (Patient not taking: Reported on 04/06/2019) 10 tablet 0  . hydrOXYzine (ATARAX/VISTARIL) 25 MG tablet Take 1 tablet (25 mg total) by mouth every 6 (six) hours as needed for itching. (Patient not taking: Reported on 04/06/2019) 12 tablet 0  . ibuprofen (ADVIL,MOTRIN) 200 MG tablet Take 800 mg by mouth every 6 (six) hours as needed. Patient used this medication for her tooth pain.    . Liraglutide -Weight Management (SAXENDA) 18 MG/3ML SOPN Saxenda 3 mg/0.5 mL (18 mg/3 mL) subcutaneous pen injector    . meloxicam (MOBIC) 15 MG tablet Take 1 tablet (15 mg total) by mouth daily. Take 1 daily with food. (Patient not taking: Reported on 04/06/2019) 10 tablet 0  . nitrofurantoin, macrocrystal-monohydrate, (MACROBID) 100 MG capsule Take 1 capsule (100 mg total) by mouth 2 (two) times daily. X 3 days (Patient not taking: Reported on 04/06/2019) 6 capsule 0  . permethrin (ELIMITE) 5 % cream permethrin 5 % topical cream  APPLY AS  DIRECTED    . phenazopyridine (PYRIDIUM) 200 MG tablet Take 1 tablet (200 mg total) by mouth 3 (three) times daily. (Patient not taking: Reported on 04/06/2019) 6 tablet 0  . predniSONE (DELTASONE) 20 MG tablet 3 tabs po day one, then 2 po daily x 4 days (Patient not taking: Reported on 04/06/2019) 11 tablet 0  . Prenatal MV-Min-FA-Omega-3 (PRENATAL GUMMIES/DHA & FA) 0.4-32.5 MG CHEW Prenatal Gummies    . PRESCRIPTION MEDICATION     . Pseudoeph-Doxylamine-DM-APAP (NYQUIL PO) Take 2 capsules by mouth daily as needed. Patient used this medication for her cold symptoms.    . traMADol (ULTRAM) 50 MG tablet Take 1-2 tablets (50-100 mg total) by mouth every 6 (six) hours as needed for pain. (Patient not taking: Reported on 04/06/2019) 10 tablet 0  . triamcinolone cream (KENALOG) 0.1 % Apply 1 application topically 2 (two) times daily. (Patient not taking: Reported on 04/06/2019) 30 g 0   No current facility-administered medications for this visit.     REVIEW OF SYSTEMS:   Constitutional: ( - ) fevers, ( - )  chills , ( - ) night sweats Eyes: ( - ) blurriness of vision, ( - ) double vision, ( - ) watery eyes Ears, nose, mouth, throat, and face: ( - ) mucositis, ( - ) sore throat Respiratory: ( - ) cough, ( - ) dyspnea, ( - ) wheezes Cardiovascular: ( - ) palpitation, ( - ) chest discomfort, ( - ) lower extremity swelling Gastrointestinal:  ( + ) nausea, ( - ) heartburn, ( - ) change in bowel habits Skin: ( - ) abnormal skin rashes Lymphatics: ( - ) new lymphadenopathy, ( - ) easy bruising Neurological: ( - ) numbness, ( - ) tingling, ( - ) new weaknesses Behavioral/Psych: ( - ) mood change, ( - ) new changes  All other systems were reviewed with the patient and are negative.  PHYSICAL EXAMINATION: ECOG PERFORMANCE STATUS: 1 - Symptomatic but completely ambulatory  Vitals:   04/06/19 1234  BP: (!) 112/59  Pulse: 97  Resp: 19  Temp: 98.6 F (37 C)  SpO2: 100%   Filed Weights   04/06/19 1234   Weight: 206 lb (93.4 kg)    GENERAL: alert, no distress and comfortable SKIN: skin color, texture, turgor are normal, no rashes or significant lesions EYES: conjunctiva are pink and non-injected, sclera clear OROPHARYNX: no exudate, no erythema; lips, buccal mucosa, and tongue normal  NECK: supple,  non-tender LUNGS: clear to auscultation with normal breathing effort HEART: regular rate & rhythm, no murmurs, no lower extremity edema ABDOMEN: soft, non-tender, non-distended, normal bowel sounds Musculoskeletal: no cyanosis of digits and no clubbing  PSYCH: alert & oriented x 3, fluent speech NEURO: no focal motor/sensory deficits  LABORATORY DATA:  I have reviewed the data as listed Lab Results  Component Value Date   WBC 11.3 (H) 04/06/2019   HGB 8.3 (L) 04/06/2019   HCT 29.5 (L) 04/06/2019   MCV 62.0 (L) 04/06/2019   PLT 465 (H) 04/06/2019   Lab Results  Component Value Date   NA 137 04/06/2019   K 3.9 04/06/2019   CL 104 04/06/2019   CO2 23 04/06/2019    PATHOLOGY: I personally reviewed the patient's peripheral blood smear today.  The red blood cells were microcytic and hypochromic, with some elliptocytes, consistent with IDA.  There was no schistocytosis.  The white blood cells were of normal morphology. There were no peripheral circulating blasts. The platelets were of normal size and I verified that there were no platelet clumping.

## 2019-04-05 ENCOUNTER — Telehealth: Payer: Self-pay | Admitting: Hematology

## 2019-04-05 NOTE — Telephone Encounter (Signed)
Patient returned call for prescreening

## 2019-04-06 ENCOUNTER — Other Ambulatory Visit: Payer: Self-pay

## 2019-04-06 ENCOUNTER — Inpatient Hospital Stay: Payer: BLUE CROSS/BLUE SHIELD

## 2019-04-06 ENCOUNTER — Encounter: Payer: Self-pay | Admitting: Hematology

## 2019-04-06 ENCOUNTER — Telehealth: Payer: Self-pay | Admitting: Hematology

## 2019-04-06 ENCOUNTER — Inpatient Hospital Stay: Payer: BLUE CROSS/BLUE SHIELD | Attending: Hematology | Admitting: Hematology

## 2019-04-06 VITALS — BP 112/59 | HR 97 | Temp 98.6°F | Resp 19 | Wt 206.0 lb

## 2019-04-06 DIAGNOSIS — D500 Iron deficiency anemia secondary to blood loss (chronic): Secondary | ICD-10-CM | POA: Insufficient documentation

## 2019-04-06 DIAGNOSIS — Z3A09 9 weeks gestation of pregnancy: Secondary | ICD-10-CM | POA: Diagnosis not present

## 2019-04-06 DIAGNOSIS — Z3685 Encounter for antenatal screening for Streptococcus B: Secondary | ICD-10-CM | POA: Diagnosis not present

## 2019-04-06 DIAGNOSIS — Z3401 Encounter for supervision of normal first pregnancy, first trimester: Secondary | ICD-10-CM | POA: Diagnosis not present

## 2019-04-06 DIAGNOSIS — Z87891 Personal history of nicotine dependence: Secondary | ICD-10-CM | POA: Diagnosis not present

## 2019-04-06 DIAGNOSIS — Z31430 Encounter of female for testing for genetic disease carrier status for procreative management: Secondary | ICD-10-CM | POA: Diagnosis not present

## 2019-04-06 DIAGNOSIS — O99011 Anemia complicating pregnancy, first trimester: Secondary | ICD-10-CM | POA: Diagnosis not present

## 2019-04-06 DIAGNOSIS — Z3481 Encounter for supervision of other normal pregnancy, first trimester: Secondary | ICD-10-CM | POA: Diagnosis not present

## 2019-04-06 LAB — CBC WITH DIFFERENTIAL (CANCER CENTER ONLY)
Abs Immature Granulocytes: 0.04 10*3/uL (ref 0.00–0.07)
Basophils Absolute: 0 10*3/uL (ref 0.0–0.1)
Basophils Relative: 0 %
Eosinophils Absolute: 0.1 10*3/uL (ref 0.0–0.5)
Eosinophils Relative: 1 %
HCT: 29.5 % — ABNORMAL LOW (ref 36.0–46.0)
Hemoglobin: 8.3 g/dL — ABNORMAL LOW (ref 12.0–15.0)
Immature Granulocytes: 0 %
Lymphocytes Relative: 26 %
Lymphs Abs: 3 10*3/uL (ref 0.7–4.0)
MCH: 17.4 pg — ABNORMAL LOW (ref 26.0–34.0)
MCHC: 28.1 g/dL — ABNORMAL LOW (ref 30.0–36.0)
MCV: 62 fL — ABNORMAL LOW (ref 80.0–100.0)
Monocytes Absolute: 0.5 10*3/uL (ref 0.1–1.0)
Monocytes Relative: 5 %
Neutro Abs: 7.6 10*3/uL (ref 1.7–7.7)
Neutrophils Relative %: 68 %
Platelet Count: 465 10*3/uL — ABNORMAL HIGH (ref 150–400)
RBC: 4.76 MIL/uL (ref 3.87–5.11)
RDW: 19.6 % — ABNORMAL HIGH (ref 11.5–15.5)
WBC Count: 11.3 10*3/uL — ABNORMAL HIGH (ref 4.0–10.5)
nRBC: 0 % (ref 0.0–0.2)

## 2019-04-06 LAB — CMP (CANCER CENTER ONLY)
ALT: 10 U/L (ref 0–44)
AST: 11 U/L — ABNORMAL LOW (ref 15–41)
Albumin: 4.3 g/dL (ref 3.5–5.0)
Alkaline Phosphatase: 52 U/L (ref 38–126)
Anion gap: 10 (ref 5–15)
BUN: 8 mg/dL (ref 6–20)
CO2: 23 mmol/L (ref 22–32)
Calcium: 9.6 mg/dL (ref 8.9–10.3)
Chloride: 104 mmol/L (ref 98–111)
Creatinine: 0.63 mg/dL (ref 0.44–1.00)
GFR, Est AFR Am: >60 mL/min (ref >60)
GFR, Estimated: >60 mL/min (ref >60)
Glucose, Bld: 76 mg/dL (ref 70–99)
Potassium: 3.9 mmol/L (ref 3.5–5.1)
Sodium: 137 mmol/L (ref 135–145)
Total Bilirubin: 0.2 mg/dL — ABNORMAL LOW (ref 0.3–1.2)
Total Protein: 7.2 g/dL (ref 6.5–8.1)

## 2019-04-06 LAB — OB RESULTS CONSOLE HIV ANTIBODY (ROUTINE TESTING): HIV: NONREACTIVE

## 2019-04-06 LAB — LACTATE DEHYDROGENASE: LDH: 109 U/L (ref 98–192)

## 2019-04-06 LAB — OB RESULTS CONSOLE RUBELLA ANTIBODY, IGM: Rubella: IMMUNE

## 2019-04-06 LAB — OB RESULTS CONSOLE GC/CHLAMYDIA
Chlamydia: NEGATIVE
Gonorrhea: NEGATIVE

## 2019-04-06 LAB — OB RESULTS CONSOLE HEPATITIS B SURFACE ANTIGEN: Hepatitis B Surface Ag: NEGATIVE

## 2019-04-06 LAB — OB RESULTS CONSOLE ABO/RH: RH Type: POSITIVE

## 2019-04-06 LAB — OB RESULTS CONSOLE ANTIBODY SCREEN: Antibody Screen: NEGATIVE

## 2019-04-06 LAB — OB RESULTS CONSOLE RPR: RPR: NONREACTIVE

## 2019-04-06 LAB — SAVE SMEAR (SSMR)

## 2019-04-06 NOTE — Telephone Encounter (Signed)
-  Return as needed per 4/23 los

## 2019-04-07 LAB — HEMOGLOBINOPATHY EVALUATION
Hgb A2 Quant: 1.3 % — ABNORMAL LOW (ref 1.8–3.2)
Hgb A: 98.7 % (ref 96.4–98.8)
Hgb C: 0 %
Hgb F Quant: 0 % (ref 0.0–2.0)
Hgb S Quant: 0 %
Hgb Variant: 0 %

## 2019-04-07 LAB — FERRITIN: Ferritin: 5 ng/mL — ABNORMAL LOW (ref 11–307)

## 2019-04-07 LAB — IRON AND TIBC
Iron: 13 ug/dL — ABNORMAL LOW (ref 41–142)
Saturation Ratios: 3 % — ABNORMAL LOW (ref 21–57)
TIBC: 486 ug/dL — ABNORMAL HIGH (ref 236–444)
UIBC: 473 ug/dL — ABNORMAL HIGH (ref 120–384)

## 2019-04-14 LAB — ALPHA-THALASSEMIA GENOTYPR

## 2019-04-17 DIAGNOSIS — Z113 Encounter for screening for infections with a predominantly sexual mode of transmission: Secondary | ICD-10-CM | POA: Diagnosis not present

## 2019-04-17 DIAGNOSIS — Z3400 Encounter for supervision of normal first pregnancy, unspecified trimester: Secondary | ICD-10-CM | POA: Diagnosis not present

## 2019-05-02 DIAGNOSIS — Z3682 Encounter for antenatal screening for nuchal translucency: Secondary | ICD-10-CM | POA: Diagnosis not present

## 2019-05-02 DIAGNOSIS — Z3A12 12 weeks gestation of pregnancy: Secondary | ICD-10-CM | POA: Diagnosis not present

## 2019-05-15 DIAGNOSIS — O99019 Anemia complicating pregnancy, unspecified trimester: Secondary | ICD-10-CM | POA: Diagnosis not present

## 2019-05-15 DIAGNOSIS — D649 Anemia, unspecified: Secondary | ICD-10-CM | POA: Diagnosis not present

## 2019-05-15 DIAGNOSIS — Z3A14 14 weeks gestation of pregnancy: Secondary | ICD-10-CM | POA: Diagnosis not present

## 2019-06-01 DIAGNOSIS — D229 Melanocytic nevi, unspecified: Secondary | ICD-10-CM | POA: Diagnosis not present

## 2019-06-01 DIAGNOSIS — Z331 Pregnant state, incidental: Secondary | ICD-10-CM | POA: Diagnosis not present

## 2019-06-01 DIAGNOSIS — L709 Acne, unspecified: Secondary | ICD-10-CM | POA: Diagnosis not present

## 2019-06-14 DIAGNOSIS — Z3682 Encounter for antenatal screening for nuchal translucency: Secondary | ICD-10-CM | POA: Diagnosis not present

## 2019-06-14 DIAGNOSIS — Z3480 Encounter for supervision of other normal pregnancy, unspecified trimester: Secondary | ICD-10-CM | POA: Diagnosis not present

## 2019-06-14 DIAGNOSIS — Z3A18 18 weeks gestation of pregnancy: Secondary | ICD-10-CM | POA: Diagnosis not present

## 2019-06-14 DIAGNOSIS — Z363 Encounter for antenatal screening for malformations: Secondary | ICD-10-CM | POA: Diagnosis not present

## 2019-07-03 DIAGNOSIS — N760 Acute vaginitis: Secondary | ICD-10-CM | POA: Diagnosis not present

## 2019-07-07 DIAGNOSIS — Z362 Encounter for other antenatal screening follow-up: Secondary | ICD-10-CM | POA: Diagnosis not present

## 2019-07-20 DIAGNOSIS — J3489 Other specified disorders of nose and nasal sinuses: Secondary | ICD-10-CM | POA: Diagnosis not present

## 2019-08-15 DIAGNOSIS — I10 Essential (primary) hypertension: Secondary | ICD-10-CM | POA: Diagnosis not present

## 2019-08-15 DIAGNOSIS — Z3A27 27 weeks gestation of pregnancy: Secondary | ICD-10-CM | POA: Diagnosis not present

## 2019-08-15 DIAGNOSIS — Z23 Encounter for immunization: Secondary | ICD-10-CM | POA: Diagnosis not present

## 2019-08-15 DIAGNOSIS — Z362 Encounter for other antenatal screening follow-up: Secondary | ICD-10-CM | POA: Diagnosis not present

## 2019-08-15 DIAGNOSIS — Z3480 Encounter for supervision of other normal pregnancy, unspecified trimester: Secondary | ICD-10-CM | POA: Diagnosis not present

## 2019-08-30 DIAGNOSIS — O99019 Anemia complicating pregnancy, unspecified trimester: Secondary | ICD-10-CM | POA: Diagnosis not present

## 2019-08-31 DIAGNOSIS — D509 Iron deficiency anemia, unspecified: Secondary | ICD-10-CM | POA: Diagnosis not present

## 2019-09-13 ENCOUNTER — Inpatient Hospital Stay (HOSPITAL_COMMUNITY)
Admission: AD | Admit: 2019-09-13 | Discharge: 2019-09-14 | Disposition: A | Discharge status: Home / Self Care | Payer: BC Managed Care – PPO | Attending: Obstetrics and Gynecology | Admitting: Obstetrics and Gynecology

## 2019-09-13 ENCOUNTER — Encounter (HOSPITAL_COMMUNITY): Payer: Self-pay | Admitting: *Deleted

## 2019-09-13 ENCOUNTER — Other Ambulatory Visit: Payer: Self-pay

## 2019-09-13 DIAGNOSIS — O368130 Decreased fetal movements, third trimester, not applicable or unspecified: Secondary | ICD-10-CM | POA: Diagnosis not present

## 2019-09-13 DIAGNOSIS — Z3A31 31 weeks gestation of pregnancy: Secondary | ICD-10-CM | POA: Insufficient documentation

## 2019-09-13 DIAGNOSIS — Z87891 Personal history of nicotine dependence: Secondary | ICD-10-CM | POA: Insufficient documentation

## 2019-09-13 HISTORY — DX: Anemia, unspecified: D64.9

## 2019-09-13 NOTE — MAU Note (Signed)
Pt reports to MAU c/o no FM today pt states she tried a candy bar and a soda and nothing helped. Pt is currently feeling movement at the bedside. Pt denies bleeding or LOF. No ctx. Pt reports slight swelling on her right foot.

## 2019-09-13 NOTE — Discharge Instructions (Signed)
Fetal Movement Counts °Patient Name: ________________________________________________ Patient Due Date: ____________________ °What is a fetal movement count? ° °A fetal movement count is the number of times that you feel your baby move during a certain amount of time. This may also be called a fetal kick count. A fetal movement count is recommended for every pregnant woman. You may be asked to start counting fetal movements as early as week 28 of your pregnancy. °Pay attention to when your baby is most active. You may notice your baby's sleep and wake cycles. You may also notice things that make your baby move more. You should do a fetal movement count: °· When your baby is normally most active. °· At the same time each day. °A good time to count movements is while you are resting, after having something to eat and drink. °How do I count fetal movements? °1. Find a quiet, comfortable area. Sit, or lie down on your side. °2. Write down the date, the start time and stop time, and the number of movements that you felt between those two times. Take this information with you to your health care visits. °3. For 2 hours, count kicks, flutters, swishes, rolls, and jabs. You should feel at least 10 movements during 2 hours. °4. You may stop counting after you have felt 10 movements. °5. If you do not feel 10 movements in 2 hours, have something to eat and drink. Then, keep resting and counting for 1 hour. If you feel at least 4 movements during that hour, you may stop counting. °Contact a health care provider if: °· You feel fewer than 4 movements in 2 hours. °· Your baby is not moving like he or she usually does. °Date: ____________ Start time: ____________ Stop time: ____________ Movements: ____________ °Date: ____________ Start time: ____________ Stop time: ____________ Movements: ____________ °Date: ____________ Start time: ____________ Stop time: ____________ Movements: ____________ °Date: ____________ Start time:  ____________ Stop time: ____________ Movements: ____________ °Date: ____________ Start time: ____________ Stop time: ____________ Movements: ____________ °Date: ____________ Start time: ____________ Stop time: ____________ Movements: ____________ °Date: ____________ Start time: ____________ Stop time: ____________ Movements: ____________ °Date: ____________ Start time: ____________ Stop time: ____________ Movements: ____________ °Date: ____________ Start time: ____________ Stop time: ____________ Movements: ____________ °This information is not intended to replace advice given to you by your health care provider. Make sure you discuss any questions you have with your health care provider. °Document Released: 12/30/2006 Document Revised: 12/20/2018 Document Reviewed: 01/09/2016 °Elsevier Patient Education © 2020 [employer] ° ° °Third Trimester of Pregnancy ° °The third trimester is from week 28 through week 40 (months 7 through 9). This trimester is when your unborn baby (fetus) is growing very fast. At the end of the ninth month, the unborn baby is about 20 inches in length. It weighs about 6-10 pounds. °Follow these instructions at home: °Medicines °· Take over-the-counter and prescription medicines only as told by your doctor. Some medicines are safe and some medicines are not safe during pregnancy. °· Take a prenatal vitamin that contains at least 600 micrograms (mcg) of folic acid. °· If you have trouble pooping (constipation), take medicine that will make your stool soft (stool softener) if your doctor approves. °Eating and drinking ° °· Eat regular, healthy meals. °· Avoid raw meat and uncooked cheese. °· If you get low calcium from the food you eat, talk to your doctor about taking a daily calcium supplement. °· Eat four or five small meals rather than three large meals a   day. °· Avoid foods that are high in fat and sugars, such as fried and sweet foods. °· To prevent constipation: °? Eat foods that are  high in fiber, like fresh fruits and vegetables, whole grains, and beans. °? Drink enough fluids to keep your pee (urine) clear or pale yellow. °Activity °· Exercise only as told by your doctor. Stop exercising if you start to have cramps. °· Avoid heavy lifting, wear low heels, and sit up straight. °· Do not exercise if it is too hot, too humid, or if you are in a place of great height (high altitude). °· You may continue to have sex unless your doctor tells you not to. °Relieving pain and discomfort °· Wear a good support bra if your breasts are tender. °· Take frequent breaks and rest with your legs raised if you have leg cramps or low back pain. °· Take warm water baths (sitz baths) to soothe pain or discomfort caused by hemorrhoids. Use hemorrhoid cream if your doctor approves. °· If you develop puffy, bulging veins (varicose veins) in your legs: °? Wear support hose or compression stockings as told by your doctor. °? Raise (elevate) your feet for 15 minutes, 3-4 times a day. °? Limit salt in your food. °Safety °· Wear your seat belt when driving. °· Make a list of emergency phone numbers, including numbers for family, friends, the hospital, and police and fire departments. °Preparing for your baby's arrival °To prepare for the arrival of your baby: °· Take prenatal classes. °· Practice driving to the hospital. °· Visit the hospital and tour the maternity area. °· Talk to your work about taking leave once the baby comes. °· Pack your hospital bag. °· Prepare the baby's room. °· Go to your doctor visits. °· Buy a rear-facing car seat. Learn how to install it in your car. °General instructions °· Do not use hot tubs, steam rooms, or saunas. °· Do not use any products that contain nicotine or tobacco, such as cigarettes and e-cigarettes. If you need help quitting, ask your doctor. °· Do not drink alcohol. °· Do not douche or use tampons or scented sanitary pads. °· Do not cross your legs for long periods of  time. °· Do not travel for long distances unless you must. Only do so if your doctor says it is okay. °· Visit your dentist if you have not gone during your pregnancy. Use a soft toothbrush to brush your teeth. Be gentle when you floss. °· Avoid cat litter boxes and soil used by cats. These carry germs that can cause birth defects in the baby and can cause a loss of your baby (miscarriage) or stillbirth. °· Keep all your prenatal visits as told by your doctor. This is important. °Contact a doctor if: °· You are not sure if you are in labor or if your water has broken. °· You are dizzy. °· You have mild cramps or pressure in your lower belly. °· You have a nagging pain in your belly area. °· You continue to feel sick to your stomach, you throw up, or you have watery poop. °· You have bad smelling fluid coming from your vagina. °· You have pain when you pee. °Get help right away if: °· You have a fever. °· You are leaking fluid from your vagina. °· You are spotting or bleeding from your vagina. °· You have severe belly cramps or pain. °· You lose or gain weight quickly. °· You have trouble catching your breath   and have chest pain. °· You notice sudden or extreme puffiness (swelling) of your face, hands, ankles, feet, or legs. °· You have not felt the baby move in over an hour. °· You have severe headaches that do not go away with medicine. °· You have trouble seeing. °· You are leaking, or you are having a gush of fluid, from your vagina before you are 37 weeks. °· You have regular belly spasms (contractions) before you are 37 weeks. °Summary °· The third trimester is from week 28 through week 40 (months 7 through 9). This time is when your unborn baby is growing very fast. °· Follow your doctor's advice about medicine, food, and activity. °· Get ready for the arrival of your baby by taking prenatal classes, getting all the baby items ready, preparing the baby's room, and visiting your doctor to be checked. °· Get  help right away if you are bleeding from your vagina, or you have chest pain and trouble catching your breath, or if you have not felt your baby move in over an hour. °This information is not intended to replace advice given to you by your health care provider. Make sure you discuss any questions you have with your health care provider. °Document Released: 02/24/2010 Document Revised: 03/23/2019 Document Reviewed: 01/05/2017 °Elsevier Patient Education © 2020 [employer] Inc[employer] ° °

## 2019-09-13 NOTE — MAU Provider Note (Signed)
Chief Complaint:  Decreased Fetal Movement   First Provider Initiated Contact with Patient 09/13/19 2342     HPI: Jacqueline Wagner is a 28 y.o. G1P0 at 7w5dwho presents to maternity admissions reporting decreased fetal movement today.  Feels it now.  Also has some swelling on left foot more than right. . She reports good fetal movement, denies LOF, vaginal bleeding, vaginal itching/burning, urinary symptoms, h/a, dizziness, n/v, diarrhea, constipation or fever/chills.  She denies headache, visual changes or RUQ abdominal pain.  RN Note: Pt reports to MAU c/o no FM today pt states she tried a candy bar and a soda and nothing helped. Pt is currently feeling movement at the bedside. Pt denies bleeding or LOF. No ctx. Pt reports slight swelling on her right foot  Past Medical History: Past Medical History:  Diagnosis Date  . Anemia   . UTI (urinary tract infection)     Past obstetric history: OB History  Gravida Para Term Preterm AB Living  1            SAB TAB Ectopic Multiple Live Births               # Outcome Date GA Lbr Len/2nd Weight Sex Delivery Anes PTL Lv  1 Current             Past Surgical History: Past Surgical History:  Procedure Laterality Date  . NO PAST SURGERIES      Family History: History reviewed. No pertinent family history.  Social History: Social History   Tobacco Use  . Smoking status: Former Smoker    Packs/day: 0.50    Types: Cigarettes  . Smokeless tobacco: Never Used  Substance Use Topics  . Alcohol use: Not Currently  . Drug use: No    Allergies:  Allergies  Allergen Reactions  . Other     Meds:  Medications Prior to Admission  Medication Sig Dispense Refill Last Dose  . Ferrous Sulfate (IRON PO) Take by mouth.   09/13/2019 at Unknown time  . Prenatal MV-Min-FA-Omega-3 (PRENATAL GUMMIES/DHA & FA) 0.4-32.5 MG CHEW Prenatal Gummies   09/13/2019 at Unknown time  . ciprofloxacin (CIPRO) 500 MG tablet Take 500 mg by mouth 2 (two) times  daily.     . ciprofloxacin (CIPRO) 500 MG tablet Take 0.5 tablets (250 mg total) by mouth 2 (two) times daily. (Patient not taking: Reported on 04/06/2019) 6 tablet 0   . DiphenhydrAMINE HCl (BENADRYL ALLERGY PO) Take 1 tablet by mouth daily as needed. Patient used this medication a rash.     Marland Kitchen HYDROcodone-acetaminophen (NORCO) 5-325 MG tablet Take 1 tablet by mouth every 4 (four) hours as needed for severe pain. (Patient not taking: Reported on 04/06/2019) 10 tablet 0   . hydrOXYzine (ATARAX/VISTARIL) 25 MG tablet Take 1 tablet (25 mg total) by mouth every 6 (six) hours as needed for itching. (Patient not taking: Reported on 04/06/2019) 12 tablet 0   . ibuprofen (ADVIL,MOTRIN) 200 MG tablet Take 800 mg by mouth every 6 (six) hours as needed. Patient used this medication for her tooth pain.     . meloxicam (MOBIC) 15 MG tablet Take 1 tablet (15 mg total) by mouth daily. Take 1 daily with food. (Patient not taking: Reported on 04/06/2019) 10 tablet 0   . nitrofurantoin, macrocrystal-monohydrate, (MACROBID) 100 MG capsule Take 1 capsule (100 mg total) by mouth 2 (two) times daily. X 3 days (Patient not taking: Reported on 04/06/2019) 6 capsule 0   . phenazopyridine (PYRIDIUM) 200  MG tablet Take 1 tablet (200 mg total) by mouth 3 (three) times daily. (Patient not taking: Reported on 04/06/2019) 6 tablet 0   . predniSONE (DELTASONE) 20 MG tablet 3 tabs po day one, then 2 po daily x 4 days (Patient not taking: Reported on 04/06/2019) 11 tablet 0   . PRESCRIPTION MEDICATION      . Pseudoeph-Doxylamine-DM-APAP (NYQUIL PO) Take 2 capsules by mouth daily as needed. Patient used this medication for her cold symptoms.     . traMADol (ULTRAM) 50 MG tablet Take 1-2 tablets (50-100 mg total) by mouth every 6 (six) hours as needed for pain. (Patient not taking: Reported on 04/06/2019) 10 tablet 0   . triamcinolone cream (KENALOG) 0.1 % Apply 1 application topically 2 (two) times daily. (Patient not taking: Reported on  04/06/2019) 30 g 0     I have reviewed patient's Past Medical Hx, Surgical Hx, Family Hx, Social Hx, medications and allergies.   ROS:  Review of Systems  Respiratory: Negative for shortness of breath.   Cardiovascular: Positive for leg swelling.  Genitourinary: Negative for pelvic pain and vaginal bleeding.  Musculoskeletal: Negative for back pain.   Other systems negative  Physical Exam   Patient Vitals for the past 24 hrs:  BP Temp Temp src Pulse Resp  09/13/19 2244 127/76 - - (!) 106 -  09/13/19 2238 140/68 98.5 F (36.9 C) Oral (!) 107 19   Vitals:   09/13/19 2238 09/13/19 2244 09/13/19 2359  BP: 140/68 127/76 (!) 115/56  Pulse: (!) 107 (!) 106 96  Resp: 19  18  Temp: 98.5 F (36.9 C)  98.2 F (36.8 C)  TempSrc: Oral  Oral  SpO2:   100%    Constitutional: Well-developed, well-nourished female in no acute distress.  Cardiovascular: normal rate and rhythm Respiratory: normal effort, clear to auscultation bilaterally GI: Abd soft, non-tender, gravid appropriate for gestational age.   No rebound or guarding. MS: Extremities nontender, no edema, normal ROM Neurologic: Alert and oriented x 4.  GU: Neg CVAT.  PELVIC EXAM:     FHT:  Baseline 140 , moderate variability, accelerations present, no decelerations Contractions: intermittent irritability   Labs: No results found for this or any previous visit (from the past 24 hour(s)).     Imaging:  No results found.  MAU Course/MDM: NST reviewed and is reactive and reassuring. Reviewed normal findings of nonstress tests and how her baby was fulfilling all of the requirements.  Treatments in MAU included EFM.    Assessment: Single intrauterine pregnancy at [redacted]w[redacted]d Decreased fetal movement Reactive fetal heart rate tracing  Plan: Discharge home Labor precautions and fetal kick counts Follow up in Office for prenatal visits and recheck  Encouraged to return here or to other Urgent Care/ED if she develops  worsening of symptoms, increase in pain, fever, or other concerning symptoms.   Pt stable at time of discharge.  Wynelle Bourgeois CNM, MSN Certified Nurse-Midwife 09/13/2019 11:42 PM

## 2019-09-14 DIAGNOSIS — O368130 Decreased fetal movements, third trimester, not applicable or unspecified: Secondary | ICD-10-CM | POA: Diagnosis not present

## 2019-09-14 DIAGNOSIS — Z0000 Encounter for general adult medical examination without abnormal findings: Secondary | ICD-10-CM | POA: Diagnosis not present

## 2019-09-14 DIAGNOSIS — Z3A31 31 weeks gestation of pregnancy: Secondary | ICD-10-CM | POA: Diagnosis not present

## 2019-09-14 DIAGNOSIS — D649 Anemia, unspecified: Secondary | ICD-10-CM | POA: Diagnosis not present

## 2019-09-14 DIAGNOSIS — Z87891 Personal history of nicotine dependence: Secondary | ICD-10-CM | POA: Diagnosis not present

## 2019-09-14 DIAGNOSIS — Z3480 Encounter for supervision of other normal pregnancy, unspecified trimester: Secondary | ICD-10-CM | POA: Diagnosis not present

## 2019-09-19 DIAGNOSIS — Z331 Pregnant state, incidental: Secondary | ICD-10-CM | POA: Diagnosis not present

## 2019-09-19 DIAGNOSIS — Z0000 Encounter for general adult medical examination without abnormal findings: Secondary | ICD-10-CM | POA: Diagnosis not present

## 2019-09-19 DIAGNOSIS — Z1331 Encounter for screening for depression: Secondary | ICD-10-CM | POA: Diagnosis not present

## 2019-09-19 DIAGNOSIS — D509 Iron deficiency anemia, unspecified: Secondary | ICD-10-CM | POA: Diagnosis not present

## 2019-09-28 DIAGNOSIS — D509 Iron deficiency anemia, unspecified: Secondary | ICD-10-CM | POA: Diagnosis not present

## 2019-09-28 DIAGNOSIS — O99013 Anemia complicating pregnancy, third trimester: Secondary | ICD-10-CM | POA: Diagnosis not present

## 2019-09-28 DIAGNOSIS — D508 Other iron deficiency anemias: Secondary | ICD-10-CM | POA: Diagnosis not present

## 2019-09-28 DIAGNOSIS — O99019 Anemia complicating pregnancy, unspecified trimester: Secondary | ICD-10-CM | POA: Diagnosis not present

## 2019-09-28 DIAGNOSIS — Z3A00 Weeks of gestation of pregnancy not specified: Secondary | ICD-10-CM | POA: Diagnosis not present

## 2019-10-04 DIAGNOSIS — O99013 Anemia complicating pregnancy, third trimester: Secondary | ICD-10-CM | POA: Diagnosis not present

## 2019-10-04 DIAGNOSIS — Z3A00 Weeks of gestation of pregnancy not specified: Secondary | ICD-10-CM | POA: Diagnosis not present

## 2019-10-04 DIAGNOSIS — D508 Other iron deficiency anemias: Secondary | ICD-10-CM | POA: Diagnosis not present

## 2019-10-05 DIAGNOSIS — O99891 Other specified diseases and conditions complicating pregnancy: Secondary | ICD-10-CM | POA: Diagnosis not present

## 2019-10-05 DIAGNOSIS — Z3A34 34 weeks gestation of pregnancy: Secondary | ICD-10-CM | POA: Diagnosis not present

## 2019-10-11 DIAGNOSIS — Z3480 Encounter for supervision of other normal pregnancy, unspecified trimester: Secondary | ICD-10-CM | POA: Diagnosis not present

## 2019-10-11 DIAGNOSIS — Z3682 Encounter for antenatal screening for nuchal translucency: Secondary | ICD-10-CM | POA: Diagnosis not present

## 2019-10-11 LAB — OB RESULTS CONSOLE GBS: GBS: NEGATIVE

## 2019-10-20 DIAGNOSIS — Z3480 Encounter for supervision of other normal pregnancy, unspecified trimester: Secondary | ICD-10-CM | POA: Diagnosis not present

## 2019-10-27 ENCOUNTER — Telehealth (HOSPITAL_COMMUNITY): Payer: Self-pay | Admitting: *Deleted

## 2019-10-27 NOTE — Telephone Encounter (Signed)
Preadmission screen  

## 2019-10-30 ENCOUNTER — Encounter (HOSPITAL_COMMUNITY): Payer: Self-pay | Admitting: *Deleted

## 2019-10-30 ENCOUNTER — Telehealth (HOSPITAL_COMMUNITY): Payer: Self-pay | Admitting: *Deleted

## 2019-10-30 NOTE — Telephone Encounter (Signed)
Preadmission screen  

## 2019-10-31 DIAGNOSIS — Z113 Encounter for screening for infections with a predominantly sexual mode of transmission: Secondary | ICD-10-CM | POA: Diagnosis not present

## 2019-11-01 ENCOUNTER — Other Ambulatory Visit (HOSPITAL_COMMUNITY)
Admission: RE | Admit: 2019-11-01 | Discharge: 2019-11-01 | Disposition: A | Discharge status: Home / Self Care | Payer: BC Managed Care – PPO | Source: Ambulatory Visit | Attending: Obstetrics & Gynecology | Admitting: Obstetrics & Gynecology

## 2019-11-01 DIAGNOSIS — Z01812 Encounter for preprocedural laboratory examination: Secondary | ICD-10-CM | POA: Insufficient documentation

## 2019-11-01 DIAGNOSIS — Z20828 Contact with and (suspected) exposure to other viral communicable diseases: Secondary | ICD-10-CM | POA: Insufficient documentation

## 2019-11-01 LAB — SARS CORONAVIRUS 2 (TAT 6-24 HRS): SARS Coronavirus 2: NEGATIVE

## 2019-11-03 ENCOUNTER — Inpatient Hospital Stay (HOSPITAL_COMMUNITY): Payer: BC Managed Care – PPO | Admitting: Anesthesiology

## 2019-11-03 ENCOUNTER — Inpatient Hospital Stay (HOSPITAL_COMMUNITY)
Admission: AD | Admit: 2019-11-03 | Discharge: 2019-11-06 | DRG: 786 | Disposition: A | Discharge status: Home / Self Care | Payer: BC Managed Care – PPO | Attending: Obstetrics & Gynecology | Admitting: Obstetrics & Gynecology

## 2019-11-03 ENCOUNTER — Other Ambulatory Visit: Payer: Self-pay

## 2019-11-03 ENCOUNTER — Inpatient Hospital Stay (HOSPITAL_COMMUNITY): Payer: BC Managed Care – PPO

## 2019-11-03 ENCOUNTER — Encounter (HOSPITAL_COMMUNITY): Payer: Self-pay

## 2019-11-03 DIAGNOSIS — Z3490 Encounter for supervision of normal pregnancy, unspecified, unspecified trimester: Secondary | ICD-10-CM

## 2019-11-03 DIAGNOSIS — O99214 Obesity complicating childbirth: Secondary | ICD-10-CM | POA: Diagnosis present

## 2019-11-03 DIAGNOSIS — O411230 Chorioamnionitis, third trimester, not applicable or unspecified: Secondary | ICD-10-CM | POA: Diagnosis present

## 2019-11-03 DIAGNOSIS — D649 Anemia, unspecified: Secondary | ICD-10-CM | POA: Diagnosis present

## 2019-11-03 DIAGNOSIS — Z3A39 39 weeks gestation of pregnancy: Secondary | ICD-10-CM

## 2019-11-03 DIAGNOSIS — O9902 Anemia complicating childbirth: Secondary | ICD-10-CM | POA: Diagnosis not present

## 2019-11-03 DIAGNOSIS — Z87891 Personal history of nicotine dependence: Secondary | ICD-10-CM

## 2019-11-03 DIAGNOSIS — O26893 Other specified pregnancy related conditions, third trimester: Secondary | ICD-10-CM | POA: Diagnosis not present

## 2019-11-03 DIAGNOSIS — Z98891 History of uterine scar from previous surgery: Secondary | ICD-10-CM

## 2019-11-03 LAB — CBC
HCT: 35.9 % — ABNORMAL LOW (ref 36.0–46.0)
Hemoglobin: 11.3 g/dL — ABNORMAL LOW (ref 12.0–15.0)
MCH: 26 pg (ref 26.0–34.0)
MCHC: 31.5 g/dL (ref 30.0–36.0)
MCV: 82.7 fL (ref 80.0–100.0)
Platelets: 254 10*3/uL (ref 150–400)
RBC: 4.34 MIL/uL (ref 3.87–5.11)
RDW: 22.1 % — ABNORMAL HIGH (ref 11.5–15.5)
WBC: 11.5 10*3/uL — ABNORMAL HIGH (ref 4.0–10.5)
nRBC: 0 % (ref 0.0–0.2)

## 2019-11-03 LAB — TYPE AND SCREEN
ABO/RH(D): O POS
Antibody Screen: NEGATIVE

## 2019-11-03 LAB — ABO/RH: ABO/RH(D): O POS

## 2019-11-03 LAB — RPR: RPR Ser Ql: NONREACTIVE

## 2019-11-03 MED ORDER — OXYCODONE-ACETAMINOPHEN 5-325 MG PO TABS: 1.0000 | ORAL_TABLET | ORAL | Status: DC | PRN

## 2019-11-03 MED ORDER — TERBUTALINE SULFATE 1 MG/ML IJ SOLN: 0.2500 mg | Freq: Once | INTRAMUSCULAR | Status: DC | PRN

## 2019-11-03 MED ORDER — LIDOCAINE HCL (PF) 1 % IJ SOLN
INTRAMUSCULAR | Status: DC | PRN
  Administered 2019-11-03 (×2): 4 mL via EPIDURAL

## 2019-11-03 MED ORDER — SODIUM CHLORIDE (PF) 0.9 % IJ SOLN
INTRAMUSCULAR | Status: DC | PRN
  Administered 2019-11-03: 12 mL/h via EPIDURAL

## 2019-11-03 MED ORDER — OXYCODONE-ACETAMINOPHEN 5-325 MG PO TABS: 2.0000 | ORAL_TABLET | ORAL | Status: DC | PRN

## 2019-11-03 MED ORDER — FENTANYL CITRATE (PF) 100 MCG/2ML IJ SOLN
50.0000 ug | INTRAMUSCULAR | Status: DC | PRN
  Administered 2019-11-03: 19:00:00 100 ug via INTRAVENOUS
  Filled 2019-11-03: qty 2

## 2019-11-03 MED ORDER — LACTATED RINGERS IV SOLN: 500.0000 mL | Freq: Once | INTRAVENOUS | Status: DC

## 2019-11-03 MED ORDER — ONDANSETRON HCL 4 MG/2ML IJ SOLN: 4.0000 mg | Freq: Four times a day (QID) | INTRAMUSCULAR | Status: DC | PRN

## 2019-11-03 MED ORDER — MISOPROSTOL 25 MCG QUARTER TABLET
25.0000 ug | ORAL_TABLET | ORAL | Status: DC | PRN
  Administered 2019-11-03 (×4): 25 ug via VAGINAL
  Filled 2019-11-03 (×4): qty 1

## 2019-11-03 MED ORDER — DIPHENHYDRAMINE HCL 50 MG/ML IJ SOLN: 12.5000 mg | INTRAMUSCULAR | Status: DC | PRN

## 2019-11-03 MED ORDER — PHENYLEPHRINE 40 MCG/ML (10ML) SYRINGE FOR IV PUSH (FOR BLOOD PRESSURE SUPPORT)
80.0000 ug | PREFILLED_SYRINGE | INTRAVENOUS | Status: DC | PRN
  Filled 2019-11-03: qty 10

## 2019-11-03 MED ORDER — LACTATED RINGERS IV SOLN: 500.0000 mL | INTRAVENOUS | Status: DC | PRN

## 2019-11-03 MED ORDER — LACTATED RINGERS IV SOLN
INTRAVENOUS | Status: DC
  Administered 2019-11-03 – 2019-11-04 (×6): via INTRAVENOUS

## 2019-11-03 MED ORDER — OXYTOCIN BOLUS FROM INFUSION: 500.0000 mL | Freq: Once | INTRAVENOUS | Status: DC

## 2019-11-03 MED ORDER — OXYTOCIN 40 UNITS IN NORMAL SALINE INFUSION - SIMPLE MED
1.0000 m[IU]/min | INTRAVENOUS | Status: DC
  Administered 2019-11-03: 2 m[IU]/min via INTRAVENOUS
  Administered 2019-11-04: 07:00:00 14 m[IU]/min via INTRAVENOUS
  Filled 2019-11-03: qty 1000

## 2019-11-03 MED ORDER — OXYTOCIN 40 UNITS IN NORMAL SALINE INFUSION - SIMPLE MED: 2.5000 [IU]/h | INTRAVENOUS | Status: DC

## 2019-11-03 MED ORDER — EPHEDRINE 5 MG/ML INJ: 10.0000 mg | INTRAVENOUS | Status: DC | PRN

## 2019-11-03 MED ORDER — SOD CITRATE-CITRIC ACID 500-334 MG/5ML PO SOLN
30.0000 mL | ORAL | Status: DC | PRN
  Administered 2019-11-04: 11:00:00 30 mL via ORAL
  Filled 2019-11-03: qty 30

## 2019-11-03 MED ORDER — ZOLPIDEM TARTRATE 5 MG PO TABS: 5.0000 mg | ORAL_TABLET | Freq: Every evening | ORAL | Status: DC | PRN

## 2019-11-03 MED ORDER — FENTANYL-BUPIVACAINE-NACL 0.5-0.125-0.9 MG/250ML-% EP SOLN
12.0000 mL/h | EPIDURAL | Status: DC | PRN
  Filled 2019-11-03: qty 250

## 2019-11-03 MED ORDER — PHENYLEPHRINE 40 MCG/ML (10ML) SYRINGE FOR IV PUSH (FOR BLOOD PRESSURE SUPPORT): 80.0000 ug | PREFILLED_SYRINGE | INTRAVENOUS | Status: DC | PRN

## 2019-11-03 MED ORDER — ACETAMINOPHEN 325 MG PO TABS: 650.0000 mg | ORAL_TABLET | ORAL | Status: DC | PRN

## 2019-11-03 MED ORDER — LIDOCAINE HCL (PF) 1 % IJ SOLN: 30.0000 mL | INTRAMUSCULAR | Status: DC | PRN

## 2019-11-03 NOTE — H&P (Signed)
Jacqueline Wagner is a 28 y.o. female G1 at [redacted]w[redacted]d presenting for elective IOL.  Patient received second dose of VMP at 0520 and is feeling mild CTX.  Active FM.  No VB or LOF.  Antepartum course complicated by chronic anemia and was followed by hematology.  She received IV Fe x 2.  Hemoglobin on admission is 11.3.  GBS negative.  OB History    Gravida  1   Para      Term      Preterm      AB      Living        SAB      TAB      Ectopic      Multiple      Live Births             Past Medical History:  Diagnosis Date  . Anemia   . UTI (urinary tract infection)    Past Surgical History:  Procedure Laterality Date  . NO PAST SURGERIES     Family History: family history includes Diabetes in her paternal grandfather; Hypertension in her father; Stroke in her paternal grandmother; Vitamin D deficiency in her sister. Social History:  reports that she has quit smoking. Her smoking use included cigarettes. She smoked 0.50 packs per day. She has never used smokeless tobacco. She reports previous alcohol use. She reports that she does not use drugs.     Maternal Diabetes: No Genetic Screening: Normal Maternal Ultrasounds/Referrals: Normal Fetal Ultrasounds or other Referrals:  None Maternal Substance Abuse:  No Significant Maternal Medications:  None Significant Maternal Lab Results:  Group B Strep negative Other Comments:  None  ROS Maternal Medical History:  Fetal activity: Perceived fetal activity is normal.   Last perceived fetal movement was within the past hour.    Prenatal complications: no prenatal complications Prenatal Complications - Diabetes: none.    Dilation: 1.5 Effacement (%): 20 Station: -2 Exam by:: Army Fossa RN Blood pressure (!) 105/56, pulse 82, temperature 98.1 F (36.7 C), temperature source Oral, resp. rate 18, height 5\' 4"  (1.626 m), weight 111.5 kg. Maternal Exam:  Uterine Assessment: Contraction strength is mild.  Contraction  frequency is irregular.   Abdomen: Patient reports no abdominal tenderness. Fundal height is c/w dates.   Estimated fetal weight is 8#.       Fetal Exam Fetal Monitor Review: Baseline rate: 140.  Variability: moderate (6-25 bpm).   Pattern: accelerations present and no decelerations.    Fetal State Assessment: Category I - tracings are normal.     Physical Exam  Constitutional: She is oriented to person, place, and time. She appears well-developed and well-nourished.  Respiratory: Effort normal.  GI: Soft. There is no rebound and no guarding.  Neurological: She is alert and oriented to person, place, and time.  Skin: Skin is warm and dry.  Psychiatric: She has a normal mood and affect. Her behavior is normal.    Prenatal labs: ABO, Rh: --/--/O POS, O POS Performed at [facility], [place] [address], [place], [place] [address]  [phone]) Antibody: NEG (11/20 0045) Rubella: Immune (04/23 0000) RPR: Nonreactive (04/23 0000)  HBsAg: Negative (04/23 0000)  HIV: Non-reactive (04/23 0000)  GBS: Negative/-- (10/28 0000)   Assessment/Plan: 27yo G1 at [redacted]w[redacted]d for IOL -Recheck cvx at 0920 to consider pitocin vs AROM -CLEA if desired -Anticipate NSVD   Linda Hedges 11/03/2019, 8:12 AM

## 2019-11-03 NOTE — Progress Notes (Signed)
Vala Raffo is a 28 y.o. G1P0 at [redacted]w[redacted]d by ultrasound admitted for induction of labor due to Elective at term.  Subjective: Feeling mild cramps.  Active FM.  Objective: BP 124/68   Pulse 76   Temp 98.6 F (37 C) (Oral)   Resp 18   Ht 5\' 4"  (1.626 m)   Wt 111.5 kg   BMI 42.21 kg/m  No intake/output data recorded. No intake/output data recorded.  FHT:  FHR: 135 bpm, variability: moderate,  accelerations:  Present,  decelerations:  Absent UC:   irregular, every 2-5 minutes SVE:   1/25/-4, VMP#3 placed  Labs: Lab Results  Component Value Date   WBC 11.5 (H) 11/03/2019   HGB 11.3 (L) 11/03/2019   HCT 35.9 (L) 11/03/2019   MCV 82.7 11/03/2019   PLT 254 11/03/2019    Assessment / Plan: Elective IOL  Labor: Progressing normally Preeclampsia:  n/a Fetal Wellbeing:  Category I Pain Control:  Labor support without medications I/D:  n/a Anticipated MOD:  NSVD  Linda Hedges 11/03/2019, 9:34 AM

## 2019-11-03 NOTE — Progress Notes (Signed)
Jacqueline Wagner is a 28 y.o. G1P0 at [redacted]w[redacted]d by ultrasound admitted for induction of labor due to Elective at term.  Subjective: No c/o.  Mild CTX.  Objective: BP 127/62   Pulse 83   Temp 98.1 F (36.7 C)   Resp 16   Ht 5\' 4"  (1.626 m)   Wt 111.5 kg   BMI 42.21 kg/m  No intake/output data recorded. No intake/output data recorded.  FHT:  FHR: 145 bpm, variability: moderate,  accelerations:  Present,  decelerations:  Absent UC:   regular, every 2-5 minutes SVE:   Dilation: 1.5 Effacement (%): 50 Station: (high) Exam by:: [place], rn  FB placed with 60cc NS  Labs: Lab Results  Component Value Date   WBC 11.5 (H) 11/03/2019   HGB 11.3 (L) 11/03/2019   HCT 35.9 (L) 11/03/2019   MCV 82.7 11/03/2019   PLT 254 11/03/2019    Assessment / Plan: IOL for maternal request at 16 weeks; s/p VMP x 4, now with FB  Labor: will start pitocin Preeclampsia:  n/a Fetal Wellbeing:  Category I Pain Control:  Labor support without medications I/D:  n/a Anticipated MOD:  NSVD  Linda Hedges 11/03/2019, 5:54 PM

## 2019-11-03 NOTE — Anesthesia Procedure Notes (Signed)
Epidural Patient location during procedure: OB Start time: 11/03/2019 10:45 PM End time: 11/03/2019 10:48 PM  Staffing Anesthesiologist: Brennan Bailey, MD Performed: anesthesiologist   Preanesthetic Checklist Completed: patient identified, pre-op evaluation, timeout performed, IV checked, risks and benefits discussed and monitors and equipment checked  Epidural Patient position: sitting Prep: site prepped and draped and DuraPrep Patient monitoring: continuous pulse ox, blood pressure and heart rate Approach: midline Location: L3-L4 Injection technique: LOR air  Needle:  Needle type: Tuohy  Needle gauge: 17 G Needle length: 9 cm Needle insertion depth: 5 cm Catheter type: closed end flexible Catheter size: 19 Gauge Catheter at skin depth: 10 cm Test dose: negative and Other (1% lidocaine)  Assessment Events: blood not aspirated, injection not painful, no injection resistance, negative IV test and no paresthesia  Additional Notes Patient identified. Risks, benefits, and alternatives discussed with patient including but not limited to bleeding, infection, nerve damage, paralysis, failed block, incomplete pain control, headache, blood pressure changes, nausea, vomiting, reactions to medication, itching, and postpartum back pain. Confirmed with bedside nurse the patient's most recent platelet count. Confirmed with patient that they are not currently taking any anticoagulation, have any bleeding history, or any family history of bleeding disorders. Patient expressed understanding and wished to proceed. All questions were answered. Sterile technique was used throughout the entire procedure. Please see nursing notes for vital signs.   Crisp LOR on first pass. Test dose was given through epidural catheter and negative prior to continuing to dose epidural or start infusion. Warning signs of high block given to the patient including shortness of breath, tingling/numbness in hands,  complete motor block, or any concerning symptoms with instructions to call for help. Patient was given instructions on fall risk and not to get out of bed. All questions and concerns addressed with instructions to call with any issues or inadequate analgesia.  Reason for block:procedure for pain

## 2019-11-03 NOTE — Anesthesia Preprocedure Evaluation (Addendum)
Anesthesia Evaluation  Patient identified by MRN, date of birth, ID band Patient awake    Reviewed: Allergy & Precautions, Patient's Chart, lab work & pertinent test results  History of Anesthesia Complications Negative for: history of anesthetic complications  Airway Mallampati: II  TM Distance: >3 FB Neck ROM: Full    Dental no notable dental hx.    Pulmonary former smoker,    Pulmonary exam normal        Cardiovascular negative cardio ROS Normal cardiovascular exam     Neuro/Psych negative neurological ROS  negative psych ROS   GI/Hepatic negative GI ROS, Neg liver ROS,   Endo/Other  Morbid obesity  Renal/GU negative Renal ROS  negative genitourinary   Musculoskeletal negative musculoskeletal ROS (+)   Abdominal   Peds  Hematology  (+) anemia ,   Anesthesia Other Findings Day of surgery medications reviewed with patient.  Reproductive/Obstetrics (+) Pregnancy                             Anesthesia Physical Anesthesia Plan  ASA: III and emergent  Anesthesia Plan: Epidural   Post-op Pain Management:    Induction:   PONV Risk Score and Plan: Treatment may vary due to age or medical condition  Airway Management Planned: Natural Airway  Additional Equipment:   Intra-op Plan:   Post-operative Plan:   Informed Consent: I have reviewed the patients History and Physical, chart, labs and discussed the procedure including the risks, benefits and alternatives for the proposed anesthesia with the patient or authorized representative who has indicated his/her understanding and acceptance.     Dental advisory given  Plan Discussed with: CRNA and Surgeon  Anesthesia Plan Comments: (C/Section for failure to progress. Will use epidural for C/Section. M. Royce Macadamia, MD)       Anesthesia Quick Evaluation

## 2019-11-04 ENCOUNTER — Encounter (HOSPITAL_COMMUNITY)
Admission: AD | Disposition: A | Discharge status: Home / Self Care | Payer: Self-pay | Source: Home / Self Care | Attending: Obstetrics & Gynecology

## 2019-11-04 ENCOUNTER — Encounter (HOSPITAL_COMMUNITY): Payer: Self-pay | Admitting: *Deleted

## 2019-11-04 DIAGNOSIS — Z98891 History of uterine scar from previous surgery: Secondary | ICD-10-CM

## 2019-11-04 SURGERY — Surgical Case
Anesthesia: Epidural

## 2019-11-04 MED ORDER — OXYTOCIN 40 UNITS IN NORMAL SALINE INFUSION - SIMPLE MED
2.5000 [IU]/h | INTRAVENOUS | Status: AC
  Administered 2019-11-04: 2.5 [IU]/h via INTRAVENOUS
  Filled 2019-11-04: qty 1000

## 2019-11-04 MED ORDER — NALOXONE HCL 0.4 MG/ML IJ SOLN: 0.4000 mg | INTRAMUSCULAR | Status: DC | PRN

## 2019-11-04 MED ORDER — DIPHENHYDRAMINE HCL 25 MG PO CAPS
25.0000 mg | ORAL_CAPSULE | ORAL | Status: DC | PRN
  Administered 2019-11-05: 10:00:00 25 mg via ORAL
  Filled 2019-11-04: qty 1

## 2019-11-04 MED ORDER — ACETAMINOPHEN 325 MG PO TABS: 650.0000 mg | ORAL_TABLET | ORAL | Status: DC | PRN

## 2019-11-04 MED ORDER — KETOROLAC TROMETHAMINE 30 MG/ML IJ SOLN
30.0000 mg | Freq: Four times a day (QID) | INTRAMUSCULAR | Status: AC | PRN
  Administered 2019-11-04: 30 mg via INTRAMUSCULAR

## 2019-11-04 MED ORDER — SODIUM CHLORIDE 0.9 % IV SOLN
2.0000 g | Freq: Four times a day (QID) | INTRAVENOUS | Status: DC
  Administered 2019-11-04: 2 g via INTRAVENOUS
  Filled 2019-11-04 (×4): qty 2000

## 2019-11-04 MED ORDER — CLINDAMYCIN PHOSPHATE 900 MG/50ML IV SOLN
900.0000 mg | Freq: Once | INTRAVENOUS | Status: AC
  Administered 2019-11-04: 900 mg via INTRAVENOUS

## 2019-11-04 MED ORDER — KETOROLAC TROMETHAMINE 30 MG/ML IJ SOLN: 30.0000 mg | Freq: Four times a day (QID) | INTRAMUSCULAR | Status: AC | PRN

## 2019-11-04 MED ORDER — LIDOCAINE-EPINEPHRINE (PF) 2 %-1:200000 IJ SOLN
INTRAMUSCULAR | Status: AC
  Filled 2019-11-04: qty 10

## 2019-11-04 MED ORDER — OXYTOCIN 10 UNIT/ML IJ SOLN
INTRAMUSCULAR | Status: DC | PRN
  Administered 2019-11-04: 40 [IU] via INTRAMUSCULAR

## 2019-11-04 MED ORDER — MENTHOL 3 MG MT LOZG: 1.0000 | LOZENGE | OROMUCOSAL | Status: DC | PRN

## 2019-11-04 MED ORDER — LACTATED RINGERS IV SOLN
INTRAVENOUS | Status: DC
  Administered 2019-11-04 – 2019-11-05 (×3): via INTRAVENOUS

## 2019-11-04 MED ORDER — ACETAMINOPHEN 500 MG PO TABS
1000.0000 mg | ORAL_TABLET | Freq: Four times a day (QID) | ORAL | Status: DC | PRN
  Administered 2019-11-04: 1000 mg via ORAL
  Filled 2019-11-04: qty 2

## 2019-11-04 MED ORDER — LIDOCAINE-EPINEPHRINE (PF) 2 %-1:200000 IJ SOLN
INTRAMUSCULAR | Status: DC | PRN
  Administered 2019-11-04: 1 mL via INTRADERMAL
  Administered 2019-11-04: 5 mL via INTRADERMAL

## 2019-11-04 MED ORDER — DIBUCAINE (PERIANAL) 1 % EX OINT: 1.0000 "application " | TOPICAL_OINTMENT | CUTANEOUS | Status: DC | PRN

## 2019-11-04 MED ORDER — SENNOSIDES-DOCUSATE SODIUM 8.6-50 MG PO TABS
2.0000 | ORAL_TABLET | ORAL | Status: DC
  Administered 2019-11-05 – 2019-11-06 (×2): 2 via ORAL
  Filled 2019-11-04 (×2): qty 2

## 2019-11-04 MED ORDER — SCOPOLAMINE 1 MG/3DAYS TD PT72
1.0000 | MEDICATED_PATCH | TRANSDERMAL | Status: DC
  Administered 2019-11-04: 1.5 mg via TRANSDERMAL
  Filled 2019-11-04: qty 1

## 2019-11-04 MED ORDER — OXYTOCIN 40 UNITS IN NORMAL SALINE INFUSION - SIMPLE MED
INTRAVENOUS | Status: AC
  Filled 2019-11-04: qty 1000

## 2019-11-04 MED ORDER — FENTANYL CITRATE (PF) 100 MCG/2ML IJ SOLN
INTRAMUSCULAR | Status: DC | PRN
  Administered 2019-11-04: 100 ug via INTRAVENOUS

## 2019-11-04 MED ORDER — SIMETHICONE 80 MG PO CHEW
80.0000 mg | CHEWABLE_TABLET | ORAL | Status: DC
  Administered 2019-11-05 (×2): 80 mg via ORAL
  Filled 2019-11-04 (×2): qty 1

## 2019-11-04 MED ORDER — SIMETHICONE 80 MG PO CHEW
80.0000 mg | CHEWABLE_TABLET | Freq: Three times a day (TID) | ORAL | Status: DC
  Administered 2019-11-05 – 2019-11-06 (×2): 80 mg via ORAL
  Filled 2019-11-04 (×4): qty 1

## 2019-11-04 MED ORDER — OXYTOCIN 40 UNITS IN NORMAL SALINE INFUSION - SIMPLE MED: INTRAVENOUS | Status: DC | PRN

## 2019-11-04 MED ORDER — KETOROLAC TROMETHAMINE 30 MG/ML IJ SOLN
INTRAMUSCULAR | Status: AC
  Filled 2019-11-04: qty 1

## 2019-11-04 MED ORDER — FENTANYL CITRATE (PF) 100 MCG/2ML IJ SOLN: 25.0000 ug | INTRAMUSCULAR | Status: DC | PRN

## 2019-11-04 MED ORDER — SODIUM BICARBONATE 8.4 % IV SOLN
INTRAVENOUS | Status: DC | PRN
  Administered 2019-11-04 (×2): 5 mL via EPIDURAL

## 2019-11-04 MED ORDER — ONDANSETRON HCL 4 MG/2ML IJ SOLN: 4.0000 mg | Freq: Three times a day (TID) | INTRAMUSCULAR | Status: DC | PRN

## 2019-11-04 MED ORDER — MEPERIDINE HCL 25 MG/ML IJ SOLN: 6.2500 mg | INTRAMUSCULAR | Status: DC | PRN

## 2019-11-04 MED ORDER — WITCH HAZEL-GLYCERIN EX PADS: 1.0000 "application " | MEDICATED_PAD | CUTANEOUS | Status: DC | PRN

## 2019-11-04 MED ORDER — NALOXONE HCL 4 MG/10ML IJ SOLN
1.0000 ug/kg/h | INTRAVENOUS | Status: DC | PRN
  Filled 2019-11-04: qty 5

## 2019-11-04 MED ORDER — DIPHENHYDRAMINE HCL 50 MG/ML IJ SOLN: 12.5000 mg | INTRAMUSCULAR | Status: DC | PRN

## 2019-11-04 MED ORDER — ZOLPIDEM TARTRATE 5 MG PO TABS: 5.0000 mg | ORAL_TABLET | Freq: Every evening | ORAL | Status: DC | PRN

## 2019-11-04 MED ORDER — DIPHENHYDRAMINE HCL 25 MG PO CAPS: 25.0000 mg | ORAL_CAPSULE | Freq: Four times a day (QID) | ORAL | Status: DC | PRN

## 2019-11-04 MED ORDER — SIMETHICONE 80 MG PO CHEW
80.0000 mg | CHEWABLE_TABLET | ORAL | Status: DC | PRN
  Administered 2019-11-06: 80 mg via ORAL

## 2019-11-04 MED ORDER — PRENATAL MULTIVITAMIN CH
1.0000 | ORAL_TABLET | Freq: Every day | ORAL | Status: DC
  Administered 2019-11-05 – 2019-11-06 (×2): 1 via ORAL
  Filled 2019-11-04 (×2): qty 1

## 2019-11-04 MED ORDER — ONDANSETRON HCL 4 MG/2ML IJ SOLN
INTRAMUSCULAR | Status: DC | PRN
  Administered 2019-11-04: 4 mg via INTRAVENOUS

## 2019-11-04 MED ORDER — MENTHOL 3 MG MT LOZG
1.0000 | LOZENGE | OROMUCOSAL | Status: DC | PRN
  Filled 2019-11-04: qty 9

## 2019-11-04 MED ORDER — MORPHINE SULFATE (PF) 0.5 MG/ML IJ SOLN
INTRAMUSCULAR | Status: AC
  Filled 2019-11-04: qty 10

## 2019-11-04 MED ORDER — LACTATED RINGERS BOLUS PEDS
250.0000 mL | Freq: Once | INTRAVENOUS | Status: AC
  Administered 2019-11-04: 22:00:00 via INTRAVENOUS

## 2019-11-04 MED ORDER — ONDANSETRON HCL 4 MG/2ML IJ SOLN
INTRAMUSCULAR | Status: AC
  Filled 2019-11-04: qty 2

## 2019-11-04 MED ORDER — SODIUM CHLORIDE 0.9 % IV SOLN
INTRAVENOUS | Status: DC | PRN
  Administered 2019-11-04: 11:00:00 via INTRAVENOUS

## 2019-11-04 MED ORDER — OXYCODONE-ACETAMINOPHEN 5-325 MG PO TABS: 1.0000 | ORAL_TABLET | ORAL | Status: DC | PRN

## 2019-11-04 MED ORDER — COCONUT OIL OIL: 1.0000 "application " | TOPICAL_OIL | Status: DC | PRN

## 2019-11-04 MED ORDER — SCOPOLAMINE 1 MG/3DAYS TD PT72
MEDICATED_PATCH | TRANSDERMAL | Status: AC
  Filled 2019-11-04: qty 1

## 2019-11-04 MED ORDER — TETANUS-DIPHTH-ACELL PERTUSSIS 5-2.5-18.5 LF-MCG/0.5 IM SUSP: 0.5000 mL | Freq: Once | INTRAMUSCULAR | Status: DC

## 2019-11-04 MED ORDER — SCOPOLAMINE 1 MG/3DAYS TD PT72
MEDICATED_PATCH | TRANSDERMAL | Status: DC | PRN
  Administered 2019-11-04: 1 via TRANSDERMAL

## 2019-11-04 MED ORDER — PHENYLEPHRINE HCL (PRESSORS) 10 MG/ML IV SOLN
INTRAVENOUS | Status: DC | PRN
  Administered 2019-11-04 (×7): 80 ug via INTRAVENOUS

## 2019-11-04 MED ORDER — FENTANYL CITRATE (PF) 100 MCG/2ML IJ SOLN
INTRAMUSCULAR | Status: AC
  Filled 2019-11-04: qty 2

## 2019-11-04 MED ORDER — GENTAMICIN SULFATE 40 MG/ML IJ SOLN
5.0000 mg/kg | INTRAVENOUS | Status: DC
  Administered 2019-11-04: 560 mg via INTRAVENOUS
  Filled 2019-11-04: qty 14

## 2019-11-04 MED ORDER — CEFAZOLIN SODIUM-DEXTROSE 2-4 GM/100ML-% IV SOLN
2.0000 g | Freq: Once | INTRAVENOUS | Status: AC
  Administered 2019-11-04: 16:00:00 2 g via INTRAVENOUS
  Filled 2019-11-04: qty 100

## 2019-11-04 MED ORDER — DEXAMETHASONE SODIUM PHOSPHATE 4 MG/ML IJ SOLN
INTRAMUSCULAR | Status: AC
  Filled 2019-11-04: qty 1

## 2019-11-04 MED ORDER — CLINDAMYCIN PHOSPHATE 900 MG/50ML IV SOLN
INTRAVENOUS | Status: AC
  Filled 2019-11-04: qty 50

## 2019-11-04 MED ORDER — IBUPROFEN 800 MG PO TABS
800.0000 mg | ORAL_TABLET | Freq: Four times a day (QID) | ORAL | Status: DC
  Administered 2019-11-05 – 2019-11-06 (×6): 800 mg via ORAL
  Filled 2019-11-04 (×7): qty 1

## 2019-11-04 MED ORDER — MORPHINE SULFATE (PF) 0.5 MG/ML IJ SOLN
INTRAMUSCULAR | Status: DC | PRN
  Administered 2019-11-04: .5 mg via INTRAVENOUS
  Administered 2019-11-04: .5 mg via EPIDURAL
  Administered 2019-11-04: 3 mg via EPIDURAL

## 2019-11-04 MED ORDER — SODIUM CHLORIDE 0.9% FLUSH: 3.0000 mL | INTRAVENOUS | Status: DC | PRN

## 2019-11-04 MED ORDER — DEXAMETHASONE SODIUM PHOSPHATE 4 MG/ML IJ SOLN
INTRAMUSCULAR | Status: DC | PRN
  Administered 2019-11-04: 4 mg via INTRAVENOUS

## 2019-11-04 MED ORDER — SODIUM CHLORIDE 0.9 % IR SOLN
Status: DC | PRN
  Administered 2019-11-04: 1000 mL

## 2019-11-04 MED ORDER — PHENYLEPHRINE 40 MCG/ML (10ML) SYRINGE FOR IV PUSH (FOR BLOOD PRESSURE SUPPORT)
PREFILLED_SYRINGE | INTRAVENOUS | Status: AC
  Filled 2019-11-04: qty 20

## 2019-11-04 SURGICAL SUPPLY — 34 items
BENZOIN TINCTURE PRP APPL 2/3 (GAUZE/BANDAGES/DRESSINGS) ×3 IMPLANT
CHLORAPREP W/TINT 26ML (MISCELLANEOUS) ×3 IMPLANT
CLAMP CORD UMBIL (MISCELLANEOUS) IMPLANT
CLOSURE STERI STRIP 1/2 X4 (GAUZE/BANDAGES/DRESSINGS) ×3 IMPLANT
CLOSURE WOUND 1/2 X4 (GAUZE/BANDAGES/DRESSINGS)
CLOTH BEACON ORANGE TIMEOUT ST (SAFETY) ×3 IMPLANT
DERMABOND ADVANCED (GAUZE/BANDAGES/DRESSINGS)
DERMABOND ADVANCED .7 DNX12 (GAUZE/BANDAGES/DRESSINGS) IMPLANT
DRSG OPSITE POSTOP 4X10 (GAUZE/BANDAGES/DRESSINGS) ×3 IMPLANT
ELECT REM PT RETURN 9FT ADLT (ELECTROSURGICAL) ×3
ELECTRODE REM PT RTRN 9FT ADLT (ELECTROSURGICAL) ×1 IMPLANT
EXTRACTOR VACUUM KIWI (MISCELLANEOUS) IMPLANT
GLOVE BIO SURGEON STRL SZ 6 (GLOVE) ×3 IMPLANT
GLOVE BIOGEL PI IND STRL 6 (GLOVE) ×2 IMPLANT
GLOVE BIOGEL PI IND STRL 7.0 (GLOVE) ×1 IMPLANT
GLOVE BIOGEL PI INDICATOR 6 (GLOVE) ×4
GLOVE BIOGEL PI INDICATOR 7.0 (GLOVE) ×2
GOWN STRL REUS W/TWL LRG LVL3 (GOWN DISPOSABLE) ×6 IMPLANT
KIT ABG SYR 3ML LUER SLIP (SYRINGE) ×3 IMPLANT
NEEDLE HYPO 25X5/8 SAFETYGLIDE (NEEDLE) ×3 IMPLANT
NS IRRIG 1000ML POUR BTL (IV SOLUTION) ×3 IMPLANT
PACK C SECTION WH (CUSTOM PROCEDURE TRAY) ×3 IMPLANT
PAD OB MATERNITY 4.3X12.25 (PERSONAL CARE ITEMS) ×3 IMPLANT
PENCIL SMOKE EVAC W/HOLSTER (ELECTROSURGICAL) ×3 IMPLANT
STRIP CLOSURE SKIN 1/2X4 (GAUZE/BANDAGES/DRESSINGS) IMPLANT
SUT CHROMIC 0 CTX 36 (SUTURE) ×12 IMPLANT
SUT MON AB 2-0 CT1 27 (SUTURE) ×3 IMPLANT
SUT PDS AB 0 CT1 27 (SUTURE) IMPLANT
SUT PLAIN 0 NONE (SUTURE) IMPLANT
SUT VIC AB 0 CT1 36 (SUTURE) IMPLANT
SUT VIC AB 4-0 KS 27 (SUTURE) IMPLANT
TOWEL OR 17X24 6PK STRL BLUE (TOWEL DISPOSABLE) ×3 IMPLANT
TRAY FOLEY W/BAG SLVR 14FR LF (SET/KITS/TRAYS/PACK) IMPLANT
WATER STERILE IRR 1000ML POUR (IV SOLUTION) ×3 IMPLANT

## 2019-11-04 NOTE — Transfer of Care (Signed)
Immediate Anesthesia Transfer of Care Note  Patient: Jacqueline Wagner  Procedure(s) Performed: CESAREAN SECTION (N/A )  Patient Location: PACU  Anesthesia Type:Epidural  Level of Consciousness: awake, alert  and oriented  Airway & Oxygen Therapy: Patient Spontanous Breathing  Post-op Assessment: Report given to RN and Post -op Vital signs reviewed and stable  Post vital signs: Reviewed and stable  Last Vitals:  Vitals Value Taken Time  BP    Temp    Pulse    Resp    SpO2      Last Pain:  Vitals:   11/04/19 1041  TempSrc: Axillary  PainSc:          Complications: No apparent anesthesia complications

## 2019-11-04 NOTE — Lactation Note (Signed)
This note was copied from a baby's chart. Lactation Consultation Note  Patient Name: Jacqueline Wagner WCHEN'I Date: 11/04/2019 Reason for consult: Initial assessment;Term;Primapara;1st time breastfeeding  P1 mother whose infant is now 70 hours old.  RN requested [place] visit with mother.  Baby was asleep on mother's chest STS when I arrived.  Mother was concerned that baby has not been interested in feeding much since arrival to the unit.  Baby did latch in the recovery room after delivery.  Reassured mother that this is typical behavior for a 7 hour old infant.  Baby is term and > 6 lbs.  Explained that the first 24 hours can be challenging and difficult to wake a baby for feeding.  Discussed techniques to help awaken a sleepy baby, STS, feeding cues and hand expression.  Mother was able to hand express and colostrum drop noted.  Demonstrated finger feeding.  Encouraged to feed 8-12 times/24 hours or sooner if baby shows feeding cues.  Reviewed cues.  Mother will continue hand expression before/after feedings to help increase milk supply.  Colostrum container provided and milk storage times reviewed.  Mother is sleepy and would like to take a nap.  I suggested she allow father to hold baby STS and try to latch again after her nap.  She will call her RN/LC for latch assistance as needed.  Suggested the football hold as a good position to begin learning breast feeding, especially since mother has had a cesarean section.  Mother interested in trying this position.  Mom made aware of O/P services, breastfeeding support groups, community resources, and our phone # for post-discharge questions.   Mother has a DEBP for home use.  She will call for assistance as needed.  Father present. RN updated.   Maternal Data Formula Feeding for Exclusion: No Has patient been taught Hand Expression?: Yes Does the patient have breastfeeding experience prior to this delivery?: No  Feeding Feeding Type: Breast  Fed  LATCH Score Latch: Too sleepy or reluctant, no latch achieved, no sucking elicited.                 Interventions    Lactation Tools Discussed/Used     Consult Status Consult Status: Follow-up Date: 11/05/19 Follow-up type: In-patient    Jaylyn Booher R Benuel Ly 11/04/2019, 7:10 PM

## 2019-11-04 NOTE — Op Note (Signed)
Jacqueline Wagner PROCEDURE DATE: 11/04/2019  PREOPERATIVE DIAGNOSIS: Intrauterine pregnancy at  [redacted]w[redacted]d weeks gestation, arrest of descent, chorioamnionitis  POSTOPERATIVE DIAGNOSIS: The same with OP presentation  PROCEDURE:  Primary Low Transverse Cesarean Section  SURGEON:  Dr. Linda Hedges  INDICATIONS: Jacqueline Wagner is a 28 y.o. G1P0 at [redacted]w[redacted]d scheduled for cesarean section secondary to arrest of descent.  The risks of cesarean section discussed with the patient included but were not limited to: bleeding which may require transfusion or reoperation; infection which may require antibiotics; injury to bowel, bladder, ureters or other surrounding organs; injury to the fetus; need for additional procedures including hysterectomy in the event of a life-threatening hemorrhage; placental abnormalities wth subsequent pregnancies, incisional problems, thromboembolic phenomenon and other postoperative/anesthesia complications. The patient concurred with the proposed plan, giving informed written consent for the procedure.    FINDINGS:  Viable female infant in cephalic (OP) presentation, APGARs 9,9: weight pending  Clear amniotic fluid.  Intact placenta, three vessel cord.  Grossly normal uterus, ovaries and fallopian tubes. .   ANESTHESIA:    Epidural ESTIMATED BLOOD LOSS: 600 ml SPECIMENS: Placenta sent to pathology COMPLICATIONS: None immediate  PROCEDURE IN DETAIL:  The patient received intravenous antibiotics and had sequential compression devices applied to her lower extremities while in the preoperative area.  She was then taken to the operating room where epidural anesthesia was dosed up to surgical level and was found to be adequate. She was then placed in a dorsal supine position with a leftward tilt, and prepped and draped in a sterile manner.  A foley catheter was placed into her bladder and attached to constant gravity.  After an adequate timeout was performed, a Pfannenstiel skin incision was  made with scalpel and carried through to the underlying layer of fascia. The fascia was incised in the midline and this incision was extended bilaterally using the Mayo scissors. Kocher clamps were applied to the superior aspect of the fascial incision and the underlying rectus muscles were dissected off bluntly. A similar process was carried out on the inferior aspect of the facial incision. The rectus muscles were separated in the midline bluntly and the peritoneum was entered bluntly.  Bladder flap was created sharply and developed bluntly.  Bladder blade was placed.  A transverse hysterotomy was made with a scalpel and extended bilaterally bluntly. The bladder blade was then removed. The infant was successfully delivered, and cord was clamped and cut and infant was handed over to awaiting neonatology team. Uterine massage was then administered and the placenta delivered intact with three-vessel cord. The uterus was cleared of clot and debris.  The hysterotomy was closed with 0 chromic.  A second imbricating suture of 0-chromic was used to reinforce the incision and aid in hemostasis.  The peritoneum and rectus muscles were noted to be hemostatic and were reapproximated using 3-0 monocryl in a running fashion.  The fascia was closed with 0-Vicryl in a running fashion with good restoration of anatomy.  The subcutaneus tissue was copiously irrigated.  The skin was closed with 4-0 vicryl in a subcuticular fashion.  Pt tolerated the procedure will.  All counts were correct x2.  Pt went to the recovery room in stable condition.

## 2019-11-04 NOTE — Progress Notes (Signed)
Pt eating when RN went to do check, requested not to do orthostatics at this time. PT will call out when finished eating to get out of bed with RN assistance

## 2019-11-04 NOTE — Progress Notes (Signed)
Jacqueline Wagner is a 28 y.o. G1P0 at [redacted]w[redacted]d by ultrasound admitted for induction of labor due to Elective at term.  Subjective: Feeling mild pressure  Objective: BP 134/73   Pulse (!) 117   Temp 99.3 F (37.4 C) (Oral)   Resp 18   Ht 5\' 4"  (1.626 m)   Wt 111.5 kg   SpO2 100%   BMI 42.21 kg/m  No intake/output data recorded. Total I/O In: -  Out: 475 [Urine:475]  FHT:  FHR: 130 bpm, variability: moderate,  accelerations:  Present,  decelerations:  Absent UC:   regular, every 3 minutes SVE:   Dilation: 10 Effacement (%): 100 Station: 0, Plus 1 Exam by:: [employer], RN   Labs: Lab Results  Component Value Date   WBC 11.5 (H) 11/03/2019   HGB 11.3 (L) 11/03/2019   HCT 35.9 (L) 11/03/2019   MCV 82.7 11/03/2019   PLT 254 11/03/2019    Assessment / Plan: Induction of labor due to maternal request,  progressing well on pitocin  Labor: Progressing normally Preeclampsia:  n/a Fetal Wellbeing:  Category I Pain Control:  Epidural I/D:  n/a Anticipated MOD:  NSVD  Linda Hedges 11/04/2019, 6:35 AM

## 2019-11-04 NOTE — Lactation Note (Signed)
This note was copied from a baby's chart. Lactation Consultation Note  Patient Name: Jacqueline Wagner EQAST'M Date: 11/04/2019 Reason for consult: Follow-up assessment;Term;1st time breastfeeding;Primapara;Other (Comment)(DAT (+))  21 hours old FT female who is being exclusively BF by her mother, she's a P1. RN Misty called [facility] for assistance mom is still having difficulties latching baby on. Baby was already nurse by the time [facility] came in the room, [place] reported to [facility] that mom and dad are concerned about baby not feeding well.  Reviewed normal newborn behavior to reassure parents and also hand expression, mom is able to get drops of colostrum. Noted that breast are very large and mom also has flat nipples, but they're compressible. Baby briefly unlatched, but LC showed mom how to relatch baby, she fed for a total of 12 minutes in football position on the right breast with a few audible swallows noted upon breast compressions.  Parents aware of baby's (+) DAT status, offered to set up a DEBP pump, but mom politely declined, she said she was tired but that she was going to think about it and let her RN know tomorrow. Reviewed cluster feeding, feeding cues and lactogenesis II.   Feeding plan:  1. Encouraged mom to keep feeding baby STS 8-12 times/24 hours or sooner if feeding cues are present 2. Hand expression and spoon/finger feeding were also encouraged  Dad present and very supportive. Parents reported all questions and concerns were answered, they're both aware of [place] OP services and will call PRN.  Maternal Data Formula Feeding for Exclusion: No Has patient been taught Hand Expression?: Yes Does the patient have breastfeeding experience prior to this delivery?: No  Feeding Feeding Type: Breast Fed  LATCH Score Latch: Repeated attempts needed to sustain latch, nipple held in mouth throughout feeding, stimulation needed to elicit sucking reflex.  Audible Swallowing: A few with  stimulation  Type of Nipple: Flat  Comfort (Breast/Nipple): Soft / non-tender  Hold (Positioning): Full assist, staff holds infant at breast  LATCH Score: 5  Interventions Interventions: Breast feeding basics reviewed;Assisted with latch;Skin to skin;Hand express;Breast massage;Breast compression;Adjust position;Position options;Support pillows  Lactation Tools Discussed/Used     Consult Status Consult Status: Follow-up Date: 11/05/19 Follow-up type: In-patient    Kirstina Leinweber Francene Boyers 11/04/2019, 10:14 PM

## 2019-11-04 NOTE — Anesthesia Postprocedure Evaluation (Signed)
Anesthesia Post Note  Patient: Jacqueline Wagner  Procedure(s) Performed: CESAREAN SECTION (N/A )     Patient location during evaluation: PACU Anesthesia Type: Epidural Level of consciousness: oriented and awake and alert Pain management: pain level controlled Vital Signs Assessment: post-procedure vital signs reviewed and stable Respiratory status: spontaneous breathing, respiratory function stable and nonlabored ventilation Cardiovascular status: blood pressure returned to baseline and stable Postop Assessment: no headache, no backache, no apparent nausea or vomiting, epidural receding and patient able to bend at knees Anesthetic complications: no    Pain Goal:                   Cary Wilford A.

## 2019-11-04 NOTE — Progress Notes (Signed)
Patient has now been complete x 5 hours.  She has effectively pushing x 3 hours with no descent from +1 station.  Patient has chorio and has been given one dose Amp/Gent.  Fetal monitoring has remained reassuring.  I recommend primary C/S for arrest of descent.  Patient is counseled re: risk of bleeding, infection, scarring, and damage to surrounding structures.  She understands implications in future pregnancies, specifically, abnormal placentation and uterine rupture risk.  All questions were answered and the patient wishes to proceed.  Dose of Clinda ordered.    Linda Hedges, DO

## 2019-11-05 LAB — CBC
HCT: 27.5 % — ABNORMAL LOW (ref 36.0–46.0)
Hemoglobin: 8.5 g/dL — ABNORMAL LOW (ref 12.0–15.0)
MCH: 26.2 pg (ref 26.0–34.0)
MCHC: 30.9 g/dL (ref 30.0–36.0)
MCV: 84.9 fL (ref 80.0–100.0)
Platelets: 192 10*3/uL (ref 150–400)
RBC: 3.24 MIL/uL — ABNORMAL LOW (ref 3.87–5.11)
RDW: 21.7 % — ABNORMAL HIGH (ref 11.5–15.5)
WBC: 16.1 10*3/uL — ABNORMAL HIGH (ref 4.0–10.5)
nRBC: 0 % (ref 0.0–0.2)

## 2019-11-05 NOTE — Progress Notes (Signed)
Dr. Lynnette Caffey notified. No new orders at this time. Instructed to continue with CBC order at 0500. Patient is currently not symptomatic while in bed, but felt dizzy with standing earlier. RN will continue to monitor.

## 2019-11-05 NOTE — Lactation Note (Signed)
This note was copied from a baby's chart. Lactation Consultation Note Baby 45 hrs old. Mom having trouble w/latching. Mom has very large pendulous breast that hang down her side.  Mom has pitting edema to bottom of breast. Hand expressed noting nipple tissue thick and areola thick. To thick for baby to get into mouth excepts on tip causing mom pain. Reverse pressure helpful but not soft enough for latch. RN had given mom #24 NS w/fits mom well. Mom requested formula d/t mom feels baby isn't getting anything or just isn't getting enough. Hand expression w/few drops of colostrum. Mom states her original plan was to pump and bottle feed.  Mom shown how to use DEBP & how to disassemble, clean, & reassemble parts.   Mom knows to pump q3h for 15-20 min.  Newborn feeding habits, STS, I&O, breast massage, supply and demand discussed. Mom requesting formula d/t she doesn't feel baby is getting enough. Mom gave baby Dory Horn formula. Encouraged to hand express and give colostrum first. Mom holding baby STS. Baby is jaundice in color, DAT+.  Patient Name: Jacqueline Wagner IRJJO'A Date: 11/05/2019 Reason for consult: Follow-up assessment;Difficult latch;Primapara;Term   Maternal Data Has patient been taught Hand Expression?: Yes Does the patient have breastfeeding experience prior to this delivery?: No  Feeding Feeding Type: Formula Nipple Type: Extra Slow Flow  LATCH Score Latch: Too sleepy or reluctant, no latch achieved, no sucking elicited.  Audible Swallowing: None  Type of Nipple: Everted at rest and after stimulation(very short shaft)  Comfort (Breast/Nipple): Filling, red/small blisters or bruises, mild/mod discomfort(edematous)  Hold (Positioning): Full assist, staff holds infant at breast  LATCH Score: 3  Interventions Interventions: Breast feeding basics reviewed;Adjust position;DEBP;Assisted with latch;Support pillows;Skin to skin;Position options;Breast massage;Expressed  milk;Hand express;Pre-pump if needed;Reverse pressure;Breast compression;Hand pump;Shells  Lactation Tools Discussed/Used Tools: Shells;Pump Shell Type: Inverted Breast pump type: Double-Electric Breast Pump Pump Review: Setup, frequency, and cleaning;Milk Storage Initiated by:: Allayne Stack RN IBCLC Date initiated:: 11/05/19   Consult Status Consult Status: Follow-up Date: 11/05/19(in pm) Follow-up type: In-patient    Theodoro Kalata 11/05/2019, 6:10 AM

## 2019-11-05 NOTE — Progress Notes (Signed)
Subjective: Postpartum Day 1: Cesarean Delivery Patient reports tolerating PO and no problems voiding.    Objective: Vital signs in last 24 hours: Temp:  [98 F (36.7 C)-101 F (38.3 C)] 98 F (36.7 C) (11/22 0749) Pulse Rate:  [74-125] 76 (11/22 0356) Resp:  [16-20] 18 (11/22 0749) BP: (88-128)/(38-92) 88/55 (11/22 0749) SpO2:  [96 %-100 %] 99 % (11/22 0350)  Physical Exam:  General: alert, cooperative and appears stated age Lochia: appropriate Uterine Fundus: firm Incision: healing well, no significant drainage, no dehiscence DVT Evaluation: No evidence of DVT seen on physical exam. Negative Homan's sign. No cords or calf tenderness.  Recent Labs    11/03/19 0045 11/05/19 0450  HGB 11.3* 8.5*  HCT 35.9* 27.5*    Assessment/Plan: Status post Cesarean section. Doing well postoperatively.  Continue current care.  Linda Hedges 11/05/2019, 8:41 AM

## 2019-11-06 MED ORDER — OXYCODONE HCL 5 MG PO TABS: 5.0000 mg | ORAL_TABLET | Freq: Four times a day (QID) | ORAL | 0 refills | Status: DC | PRN

## 2019-11-06 MED ORDER — IBUPROFEN 800 MG PO TABS: 800.0000 mg | ORAL_TABLET | Freq: Three times a day (TID) | ORAL | 0 refills | Status: DC | PRN

## 2019-11-06 MED ORDER — ACETAMINOPHEN 325 MG PO TABS: 650.0000 mg | ORAL_TABLET | Freq: Four times a day (QID) | ORAL | 0 refills | Status: DC | PRN

## 2019-11-06 NOTE — Progress Notes (Signed)
Subjective: Postpartum Day 2: Cesarean Delivery Patient reports tolerating PO, + flatus and no problems voiding.   Baby is spitting up>patient is worried Objective: Vital signs in last 24 hours: Temp:  [97.9 F (36.6 C)-98.4 F (36.9 C)] 98 F (36.7 C) (11/23 0500) Pulse Rate:  [82-85] 82 (11/23 0500) Resp:  [18] 18 (11/23 0500) BP: (92-109)/(51-75) 104/75 (11/23 0500) SpO2:  [99 %] 99 % (11/22 1457)  Physical Exam:  General: alert, cooperative and no distress Lochia: appropriate Uterine Fundus: firm Incision: healing well DVT Evaluation: No evidence of DVT seen on physical exam.  Recent Labs    11/05/19 0450  HGB 8.5*  HCT 27.5*    Assessment/Plan: Status post Cesarean section. Doing well postoperatively.  Continue current care.  Shon Millet II 11/06/2019, 7:52 AM

## 2019-11-06 NOTE — Lactation Note (Signed)
This note was copied from a baby's chart. Lactation Consultation Note  Patient Name: Jacqueline Wagner [id number] Date: 11/06/2019   Randel Books is 58 hours old and baby is mainly formula feeding. Mother states she had areola edema and that is why she is not breastfeeding. She also stated she would prefer to pump and bottle feed. FOB asked what the risks are of formula feeding and education was provided. LC also stated that we would support whatever decision mother makes to on how she wants to feed her baby. Reviewed pumping frequency.  Mother has DEBP at home. Mother did not want assistance latching baby at this time. Suggest OP appt if desired.   Reviewed engorgement care and monitoring voids/stools.      Maternal Data    Feeding Feeding Type: Bottle Fed - Formula Nipple Type: Slow - flow  LATCH Score                   Interventions    Lactation Tools Discussed/Used     Consult Status      Carlye Grippe 11/06/2019, 11:24 AM

## 2019-11-07 NOTE — Discharge Summary (Signed)
Obstetric Discharge Summary Reason for Admission: induction of labor Prenatal Procedures: none Intrapartum Procedures: cesarean: low cervical, transverse Postpartum Procedures: none Complications-Operative and Postpartum: none Hemoglobin  Date Value Ref Range Status  11/05/2019 8.5 (L) 12.0 - 15.0 g/dL Final  04/06/2019 8.3 (L) 12.0 - 15.0 g/dL Final    Comment:    Reticulocyte Hemoglobin testing may be clinically indicated, consider ordering this additional test [id number]    HCT  Date Value Ref Range Status  11/05/2019 27.5 (L) 36.0 - 46.0 % Final    Physical Exam:  General: alert, cooperative and no distress Lochia: appropriate Uterine Fundus: firm Incision: healing well DVT Evaluation: No evidence of DVT seen on physical exam.  Discharge Diagnoses: Term Pregnancy-delivered  Discharge Information: Date: 11/07/2019 Activity: pelvic rest Diet: routine Medications: PNV, Ibuprofen and Iron Condition: stable Instructions: refer to practice specific booklet Discharge to: home   Newborn Data: Live born female  Birth Weight: 6 lb 5.6 oz (2880 g) APGAR: 54, 9  Newborn Delivery   Birth date/time: [birth year]/04/2021 11:15:00 Delivery type: C-Section, Low Transverse Trial of labor: Yes C-section categorization: Primary      Home with mother.  Jacqueline Wagner 11/07/2019, 7:47 AM

## 2019-11-08 ENCOUNTER — Emergency Department (HOSPITAL_COMMUNITY)
Admission: EM | Admit: 2019-11-08 | Discharge: 2019-11-08 | Disposition: A | Discharge status: Home / Self Care | Payer: BC Managed Care – PPO | Attending: Emergency Medicine | Admitting: Emergency Medicine

## 2019-11-08 ENCOUNTER — Other Ambulatory Visit: Payer: Self-pay

## 2019-11-08 ENCOUNTER — Encounter (HOSPITAL_COMMUNITY): Payer: Self-pay | Admitting: Emergency Medicine

## 2019-11-08 DIAGNOSIS — Z5321 Procedure and treatment not carried out due to patient leaving prior to being seen by health care provider: Secondary | ICD-10-CM | POA: Insufficient documentation

## 2019-11-08 DIAGNOSIS — I10 Essential (primary) hypertension: Secondary | ICD-10-CM | POA: Diagnosis not present

## 2019-11-08 NOTE — ED Triage Notes (Signed)
Patient reports elevated blood pressure at home this evening 155/80 with mild nausea , no fever or chills , respirations unlabored , she is 4 days post partum .

## 2019-11-08 NOTE — ED Notes (Signed)
Patient decided to leave and will see her MD next week .

## 2019-11-10 ENCOUNTER — Inpatient Hospital Stay (HOSPITAL_COMMUNITY)
Admission: AD | Admit: 2019-11-10 | Discharge: 2019-11-10 | Disposition: A | Discharge status: Home / Self Care | Payer: BC Managed Care – PPO | Attending: Obstetrics and Gynecology | Admitting: Obstetrics and Gynecology

## 2019-11-10 ENCOUNTER — Other Ambulatory Visit: Payer: Self-pay

## 2019-11-10 ENCOUNTER — Encounter (HOSPITAL_COMMUNITY): Payer: Self-pay

## 2019-11-10 DIAGNOSIS — R519 Headache, unspecified: Secondary | ICD-10-CM | POA: Insufficient documentation

## 2019-11-10 DIAGNOSIS — O9089 Other complications of the puerperium, not elsewhere classified: Secondary | ICD-10-CM | POA: Insufficient documentation

## 2019-11-10 DIAGNOSIS — Z4889 Encounter for other specified surgical aftercare: Secondary | ICD-10-CM

## 2019-11-10 DIAGNOSIS — Z87891 Personal history of nicotine dependence: Secondary | ICD-10-CM | POA: Insufficient documentation

## 2019-11-10 DIAGNOSIS — Z8249 Family history of ischemic heart disease and other diseases of the circulatory system: Secondary | ICD-10-CM | POA: Diagnosis not present

## 2019-11-10 DIAGNOSIS — O165 Unspecified maternal hypertension, complicating the puerperium: Secondary | ICD-10-CM | POA: Diagnosis not present

## 2019-11-10 DIAGNOSIS — Z98891 History of uterine scar from previous surgery: Secondary | ICD-10-CM

## 2019-11-10 LAB — CBC
HCT: 30.6 % — ABNORMAL LOW (ref 36.0–46.0)
Hemoglobin: 9.9 g/dL — ABNORMAL LOW (ref 12.0–15.0)
MCH: 26.4 pg (ref 26.0–34.0)
MCHC: 32.4 g/dL (ref 30.0–36.0)
MCV: 81.6 fL (ref 80.0–100.0)
Platelets: 398 10*3/uL (ref 150–400)
RBC: 3.75 MIL/uL — ABNORMAL LOW (ref 3.87–5.11)
RDW: 19.1 % — ABNORMAL HIGH (ref 11.5–15.5)
WBC: 10.6 10*3/uL — ABNORMAL HIGH (ref 4.0–10.5)
nRBC: 0.3 % — ABNORMAL HIGH (ref 0.0–0.2)

## 2019-11-10 LAB — COMPREHENSIVE METABOLIC PANEL
ALT: 22 U/L (ref 0–44)
AST: 22 U/L (ref 15–41)
Albumin: 2.7 g/dL — ABNORMAL LOW (ref 3.5–5.0)
Alkaline Phosphatase: 76 U/L (ref 38–126)
Anion gap: 10 (ref 5–15)
BUN: <5 mg/dL — ABNORMAL LOW (ref 6–20)
CO2: 22 mmol/L (ref 22–32)
Calcium: 8.6 mg/dL — ABNORMAL LOW (ref 8.9–10.3)
Chloride: 109 mmol/L (ref 98–111)
Creatinine, Ser: 0.79 mg/dL (ref 0.44–1.00)
GFR calc Af Amer: >60 mL/min (ref >60)
GFR calc non Af Amer: >60 mL/min (ref >60)
Glucose, Bld: 75 mg/dL (ref 70–99)
Potassium: 3.7 mmol/L (ref 3.5–5.1)
Sodium: 141 mmol/L (ref 135–145)
Total Bilirubin: 0.6 mg/dL (ref 0.3–1.2)
Total Protein: 6.2 g/dL — ABNORMAL LOW (ref 6.5–8.1)

## 2019-11-10 LAB — PROTEIN / CREATININE RATIO, URINE
Creatinine, Urine: 67.38 mg/dL
Total Protein, Urine: <6 mg/dL

## 2019-11-10 MED ORDER — AMLODIPINE BESYLATE 5 MG PO TABS: 5.0000 mg | ORAL_TABLET | Freq: Every day | ORAL | 1 refills | Status: DC

## 2019-11-10 MED ORDER — AMLODIPINE BESYLATE 5 MG PO TABS
5.0000 mg | ORAL_TABLET | Freq: Every day | ORAL | Status: DC
  Administered 2019-11-10: 5 mg via ORAL
  Filled 2019-11-10: qty 1

## 2019-11-10 NOTE — MAU Note (Signed)
Called lab for update on Protein/ Creatinine Ratio. Lab tech stated that he will begin working on it.

## 2019-11-10 NOTE — MAU Note (Signed)
Patient presents to MAU 6 days postpartum complaining of elevated blood pressure. She stated she has a monitor at home at it was 154/91. Headache that began on 11/08/19 rating it a  3/10. Pt reports "foggy/tunneled" vision.

## 2019-11-10 NOTE — Discharge Instructions (Signed)
Postpartum Hypertension °Postpartum hypertension is high blood pressure that remains higher than normal after childbirth. You may not realize that you have postpartum hypertension if your blood pressure is not being checked regularly. In most cases, postpartum hypertension will go away on its own, usually within a week of delivery. However, for some women, medical treatment is required to prevent serious complications, such as seizures or stroke. °What are the causes? °This condition may be caused by one or more of the following: °· Hypertension that existed before pregnancy (chronic hypertension). °· Hypertension that comes on as a result of pregnancy (gestational hypertension). °· Hypertensive disorders during pregnancy (preeclampsia) or seizures in women who have high blood pressure during pregnancy (eclampsia). °· A condition in which the liver, platelets, and red blood cells are damaged during pregnancy (HELLP syndrome). °· A condition in which the thyroid produces too much hormones (hyperthyroidism). °· Other rare problems of the nerves (neurological disorders) or blood disorders. °In some cases, the cause may not be known. °What increases the risk? °The following factors may make you more likely to develop this condition: °· Chronic hypertension. In some cases, this may not have been diagnosed before pregnancy. °· Obesity. °· Type 2 diabetes. °· Kidney disease. °· History of preeclampsia or eclampsia. °· Other medical conditions that change the level of hormones in the body (hormonal imbalance). °What are the signs or symptoms? °As with all types of hypertension, postpartum hypertension may not have any symptoms. Depending on how high your blood pressure is, you may experience: °· Headaches. These may be mild, moderate, or severe. They may also be steady, constant, or sudden in onset (thunderclap headache). °· Changes in your ability to see (visual changes). °· Dizziness. °· Shortness of breath. °· Swelling  of your hands, feet, lower legs, or face. In some cases, you may have swelling in more than one of these locations. °· Heart palpitations or a racing heartbeat. °· Difficulty breathing while lying down. °· Decrease in the amount of urine that you pass. °Other rare signs and symptoms may include: °· Sweating more than usual. This lasts longer than a few days after delivery. °· Chest pain. °· Sudden dizziness when you get up from sitting or lying down. °· Seizures. °· Nausea or vomiting. °· Abdominal pain. °How is this diagnosed? °This condition may be diagnosed based on the results of a physical exam, blood pressure measurements, and blood and urine tests. °You may also have other tests, such as a CT scan or an MRI, to check for other problems of postpartum hypertension. °How is this treated? °If blood pressure is high enough to require treatment, your options may include: °· Medicines to reduce blood pressure (antihypertensives). Tell your health care provider if you are breastfeeding or if you plan to breastfeed. There are many antihypertensive medicines that are safe to take while breastfeeding. °· Stopping medicines that may be causing hypertension. °· Treating medical conditions that are causing hypertension. °· Treating the complications of hypertension, such as seizures, stroke, or kidney problems. °Your health care provider will also continue to monitor your blood pressure closely until it is within a safe range for you. °Follow these instructions at home: °· Take over-the-counter and prescription medicines only as told by your health care provider. °· Return to your normal activities as told by your health care provider. Ask your health care provider what activities are safe for you. °· Do not use any products that contain nicotine or tobacco, such as cigarettes and e-cigarettes. If   you need help quitting, ask your health care provider. °· Keep all follow-up visits as told by your health care provider. This  is important. °Contact a health care provider if: °· Your symptoms get worse. °· You have new symptoms, such as: °? A headache that does not get better. °? Dizziness. °? Visual changes. °Get help right away if: °· You suddenly develop swelling in your hands, ankles, or face. °· You have sudden, rapid weight gain. °· You develop difficulty breathing, chest pain, racing heartbeat, or heart palpitations. °· You develop severe pain in your abdomen. °· You have any symptoms of a stroke. "BE FAST" is an easy way to remember the main warning signs of a stroke: °? B - Balance. Signs are dizziness, sudden trouble walking, or loss of balance. °? E - Eyes. Signs are trouble seeing or a sudden change in vision. °? F - Face. Signs are sudden weakness or numbness of the face, or the face or eyelid drooping on one side. °? A - Arms. Signs are weakness or numbness in an arm. This happens suddenly and usually on one side of the body. °? S - Speech. Signs are sudden trouble speaking, slurred speech, or trouble understanding what people say. °? T - Time. Time to call emergency services. Write down what time symptoms started. °· You have other signs of a stroke, such as: °? A sudden, severe headache with no known cause. °? Nausea or vomiting. °? Seizure. °These symptoms may represent a serious problem that is an emergency. Do not wait to see if the symptoms will go away. Get medical help right away. Call your local emergency services (911 in the [place] Do not drive yourself to the hospital. °Summary °· Postpartum hypertension is high blood pressure that remains higher than normal after childbirth. °· In most cases, postpartum hypertension will go away on its own, usually within a week of delivery. °· For some women, medical treatment is required to prevent serious complications, such as seizures or stroke. °This information is not intended to replace advice given to you by your health care provider. Make sure you discuss any questions  you have with your health care provider. °Document Released: 08/03/2014 Document Revised: 01/06/2019 Document Reviewed: 09/20/2017 °Elsevier Patient Education © 2020 [employer] ° °

## 2019-11-10 NOTE — MAU Provider Note (Addendum)
History     CSN: [id number]  Arrival date and time: 11/10/19 1220   First Provider Initiated Contact with Patient 11/10/19 1313      Chief Complaint  Patient presents with  . Hypertension   Jacqueline Wagner is a 28 y.o. G1P1001 at [redacted]w[redacted]d who receives care at [facility].  She presents today for Hypertension.  She states she has been having elevated blood pressures since Wednesday, but has been trying to wait for her doctors office to open.  She states she called today and learned they were closed, but was instructed to come in.  She states she had a blood pressures of 150s/90s prior to arrival.  She denies issues with blood pressure during the pregnancy and goes on to report low blood pressures after delivery.  She reports having some visual disturbances (foggy vision on Wednesday) and headache that started prior to arrival.  She rates the headache a 3/10, states it is constant, and reports it is located behind her left eye.   Patient reports that her incisional pain is "manageable" and states she has not taken any pain medication since Wednesday.      OB History    Gravida  1   Para  1   Term  1   Preterm      AB      Living  1     SAB      TAB      Ectopic      Multiple  0   Live Births  1           Past Medical History:  Diagnosis Date  . Anemia   . UTI (urinary tract infection)     Past Surgical History:  Procedure Laterality Date  . CESAREAN SECTION N/A 11/04/2019   Procedure: CESAREAN SECTION;  Surgeon: Mitchel Honour, DO;  Location: [facility] LD ORS;  Service: Obstetrics;  Laterality: N/A;  . NO PAST SURGERIES      Family History  Problem Relation Age of Onset  . Hypertension Father   . Vitamin D deficiency Sister   . Stroke Paternal Grandmother   . Diabetes Paternal Grandfather     Social History   Tobacco Use  . Smoking status: Former Smoker    Packs/day: 0.50    Types: Cigarettes  . Smokeless tobacco: Never Used  Substance Use  Topics  . Alcohol use: Not Currently  . Drug use: No    Allergies:  Allergies  Allergen Reactions  . Papaya Derivatives Hives    Medications Prior to Admission  Medication Sig Dispense Refill Last Dose  . acetaminophen (TYLENOL) 325 MG tablet Take 2 tablets (650 mg total) by mouth every 6 (six) hours as needed for mild pain (temperature > 101.5.). 60 tablet 0 Past Week at Unknown time  . Ferrous Sulfate (IRON PO) Take 325 mg by mouth daily.    11/10/2019 at Unknown time  . ibuprofen (ADVIL) 800 MG tablet Take 1 tablet (800 mg total) by mouth every 8 (eight) hours as needed. 30 tablet 0 Past Week at Unknown time  . Prenatal MV-Min-FA-Omega-3 (PRENATAL GUMMIES/DHA & FA) 0.4-32.5 MG CHEW Chew 1 tablet by mouth daily.    11/09/2019 at Unknown time  . oxyCODONE (OXY IR/ROXICODONE) 5 MG immediate release tablet Take 1 tablet (5 mg total) by mouth every 6 (six) hours as needed for severe pain. 20 tablet 0     Review of Systems  Constitutional: Negative for chills and fever.  Eyes:  Negative for visual disturbance. Photophobia: None currently.  Respiratory: Negative for cough and shortness of breath.   Gastrointestinal: Positive for abdominal pain and nausea. Negative for constipation, diarrhea (Have loose stool that she contributes to stool softeners. ) and vomiting.  Genitourinary: Positive for vaginal bleeding. Negative for difficulty urinating, dysuria and vaginal discharge.  Neurological: Positive for headaches. Negative for dizziness and light-headedness.   Physical Exam   Blood pressure 140/79, pulse 67, temperature 98.7 F (37.1 C), temperature source Oral, resp. rate 18, SpO2 100 %, unknown if currently breastfeeding. Vitals:   11/10/19 1332 11/10/19 1401 11/10/19 1417 11/10/19 1432  BP: 139/81 (!) 146/86 138/88 138/81  Pulse: 69 67 67 66  Resp:      Temp:      TempSrc:      SpO2:        Physical Exam  Constitutional: She is oriented to person, place, and time. She appears  well-developed and well-nourished.  Obese  HENT:  Head: Normocephalic and atraumatic.  Eyes: Conjunctivae are normal.  Neck: Normal range of motion.  Cardiovascular: Normal rate and normal heart sounds.  Respiratory: Effort normal and breath sounds normal.  GI: Soft. Bowel sounds are normal. There is abdominal tenderness.  Appropriate tender at incisional site and fundus. Honeycomb dressing removed without difficulty.  Steri strips in place, clean. Incision without apparent drainage.  Musculoskeletal: Normal range of motion.        General: No edema.  Neurological: She is alert and oriented to person, place, and time.  Skin: Skin is dry and intact.  Psychiatric: She has a normal mood and affect.    MAU Course  Procedures Results for orders placed or performed during the hospital encounter of 11/10/19 (from the past 24 hour(s))  Protein / creatinine ratio, urine     Status: None   Collection Time: 11/10/19  1:33 PM  Result Value Ref Range   Creatinine, Urine 67.38 mg/dL   Total Protein, Urine <6.0 mg/dL   Protein Creatinine Ratio        0.00 - 0.15 mg/mg[Cre]  CBC     Status: Abnormal   Collection Time: 11/10/19  1:47 PM  Result Value Ref Range   WBC 10.6 (H) 4.0 - 10.5 K/uL   RBC 3.75 (L) 3.87 - 5.11 MIL/uL   Hemoglobin 9.9 (L) 12.0 - 15.0 g/dL   HCT 30.6 (L) 36.0 - 46.0 %   MCV 81.6 80.0 - 100.0 fL   MCH 26.4 26.0 - 34.0 pg   MCHC 32.4 30.0 - 36.0 g/dL   RDW 19.1 (H) 11.5 - 15.5 %   Platelets 398 150 - 400 K/uL   nRBC 0.3 (H) 0.0 - 0.2 %  Comprehensive metabolic panel     Status: Abnormal   Collection Time: 11/10/19  1:47 PM  Result Value Ref Range   Sodium 141 135 - 145 mmol/L   Potassium 3.7 3.5 - 5.1 mmol/L   Chloride 109 98 - 111 mmol/L   CO2 22 22 - 32 mmol/L   Glucose, Bld 75 70 - 99 mg/dL   BUN <5 (L) 6 - 20 mg/dL   Creatinine, Ser 0.79 0.44 - 1.00 mg/dL   Calcium 8.6 (L) 8.9 - 10.3 mg/dL   Total Protein 6.2 (L) 6.5 - 8.1 g/dL   Albumin 2.7 (L) 3.5 - 5.0  g/dL   AST 22 15 - 41 U/L   ALT 22 0 - 44 U/L   Alkaline Phosphatase 76 38 - 126 U/L  Total Bilirubin 0.6 0.3 - 1.2 mg/dL   GFR calc non Af Amer >60 >60 mL/min   GFR calc Af Amer >60 >60 mL/min   Anion gap 10 5 - 15    MDM Physical Exam Labs: CBC, CMP, PC Ratio Measure BPQ15 min Assessment and Plan  28 year old Postpartum State Headache Elevated BP Wound Care  -Discussed POC -Patient informed that if findings significant for postpartum preeclampsia, the recommendation would be for admission for 24 hour magnesium. -Patient becomes tearful and states she does not want to be admitted with Covid patients. -[facility]ass[facility]. -Physical Exam performed and reviewed. -Incision care guidelines: how to clean, when to call, and anticipated healing.  -Labs ordered. -Will collect urine via cath as current urine is visually bloody. -Patient and sister without further questions or concerns. -Will await results.   Cherre RobinsJessica L Jinna Weinman, MSN, CNM 11/10/2019, 1:13 PM   Reassessment (3:47 PM)  -Lab called as PC Ratio still pending.  Reports now in process. -In room to update patient who was informed that all other labs appear normal. -Discussed that if PCR returns normal would discharge to home with follow up for bp check on Monday. -Discussed that provider would consult with MD prior to making final decision regarding initiation of antihypertensives. -Patient without further questions or concerns. -Will await results.   Reassessment (4:54 PM) PC Ratio Normal Postpartum Hypertension  Vitals:   11/10/19 1547 11/10/19 1602 11/10/19 1617 11/10/19 1632  BP: 138/84 (!) 141/82 (!) 144/80 139/83  Pulse: 68 66 67 68  Resp:      Temp:      TempSrc:      SpO2:       -Dr. Austin MilesM. Ervin consulted and informed of patient status, lab results, and blood pressures. *Advises initiation of norvasc 5mg  daily and to give first dose now. -Patient updated  on results and plan of care. -Informed that she will need to follow up on Monday or Tuesday for bp check and that office will be contacted.  -Patient without questions or concerns. -Instructed to return for any blood pressures >/=150/95 while taking medication. -Encouraged to call or return to MAU if symptoms worsen or with the onset of new symptoms. -Discharged to home in improved condition. -Message left on [facility] voicemail regarding follow up.   Cherre RobinsJessica L Kajah Santizo MSN, CNM Advanced Practice Provider, [facility]

## 2019-11-14 DIAGNOSIS — I10 Essential (primary) hypertension: Secondary | ICD-10-CM | POA: Diagnosis not present

## 2019-11-29 DIAGNOSIS — I10 Essential (primary) hypertension: Secondary | ICD-10-CM | POA: Diagnosis not present

## 2019-12-18 DIAGNOSIS — O1495 Unspecified pre-eclampsia, complicating the puerperium: Secondary | ICD-10-CM | POA: Diagnosis not present

## 2019-12-18 DIAGNOSIS — D649 Anemia, unspecified: Secondary | ICD-10-CM | POA: Diagnosis not present

## 2019-12-18 DIAGNOSIS — Z1389 Encounter for screening for other disorder: Secondary | ICD-10-CM | POA: Diagnosis not present

## 2020-01-19 ENCOUNTER — Other Ambulatory Visit: Payer: Self-pay

## 2020-01-19 ENCOUNTER — Other Ambulatory Visit (HOSPITAL_BASED_OUTPATIENT_CLINIC_OR_DEPARTMENT_OTHER): Payer: Self-pay | Admitting: Internal Medicine

## 2020-01-19 ENCOUNTER — Ambulatory Visit (HOSPITAL_BASED_OUTPATIENT_CLINIC_OR_DEPARTMENT_OTHER)
Admission: RE | Admit: 2020-01-19 | Discharge: 2020-01-19 | Disposition: A | Discharge status: Home / Self Care | Payer: BC Managed Care – PPO | Source: Ambulatory Visit | Attending: Internal Medicine | Admitting: Internal Medicine

## 2020-01-19 DIAGNOSIS — M79661 Pain in right lower leg: Secondary | ICD-10-CM | POA: Diagnosis not present

## 2020-01-19 DIAGNOSIS — M79604 Pain in right leg: Secondary | ICD-10-CM

## 2020-03-31 ENCOUNTER — Encounter (HOSPITAL_BASED_OUTPATIENT_CLINIC_OR_DEPARTMENT_OTHER): Payer: Self-pay | Admitting: Emergency Medicine

## 2020-03-31 ENCOUNTER — Other Ambulatory Visit: Payer: Self-pay

## 2020-03-31 ENCOUNTER — Emergency Department (HOSPITAL_BASED_OUTPATIENT_CLINIC_OR_DEPARTMENT_OTHER)
Admission: EM | Admit: 2020-03-31 | Discharge: 2020-03-31 | Disposition: A | Discharge status: Home / Self Care | Payer: BC Managed Care – PPO | Attending: Emergency Medicine | Admitting: Emergency Medicine

## 2020-03-31 DIAGNOSIS — Z79899 Other long term (current) drug therapy: Secondary | ICD-10-CM | POA: Diagnosis not present

## 2020-03-31 DIAGNOSIS — M5441 Lumbago with sciatica, right side: Secondary | ICD-10-CM | POA: Diagnosis not present

## 2020-03-31 DIAGNOSIS — Z87891 Personal history of nicotine dependence: Secondary | ICD-10-CM | POA: Insufficient documentation

## 2020-03-31 DIAGNOSIS — M545 Low back pain: Secondary | ICD-10-CM | POA: Diagnosis not present

## 2020-03-31 LAB — URINALYSIS, ROUTINE W REFLEX MICROSCOPIC
Bilirubin Urine: NEGATIVE
Glucose, UA: NEGATIVE mg/dL
Ketones, ur: NEGATIVE mg/dL
Nitrite: NEGATIVE
Protein, ur: NEGATIVE mg/dL
Specific Gravity, Urine: >1.03 — ABNORMAL HIGH (ref 1.005–1.030)
pH: 6 (ref 5.0–8.0)

## 2020-03-31 LAB — URINALYSIS, MICROSCOPIC (REFLEX)

## 2020-03-31 LAB — PREGNANCY, URINE: Preg Test, Ur: NEGATIVE

## 2020-03-31 MED ORDER — CYCLOBENZAPRINE HCL 10 MG PO TABS
10.0000 mg | ORAL_TABLET | Freq: Once | ORAL | Status: AC
  Administered 2020-03-31: 10 mg via ORAL
  Filled 2020-03-31: qty 1

## 2020-03-31 MED ORDER — CEPHALEXIN 500 MG PO CAPS: 500.0000 mg | ORAL_CAPSULE | Freq: Two times a day (BID) | ORAL | 0 refills | Status: AC

## 2020-03-31 MED ORDER — CYCLOBENZAPRINE HCL 10 MG PO TABS: 10.0000 mg | ORAL_TABLET | Freq: Two times a day (BID) | ORAL | 0 refills | Status: DC | PRN

## 2020-03-31 MED ORDER — IBUPROFEN 400 MG PO TABS
400.0000 mg | ORAL_TABLET | Freq: Once | ORAL | Status: AC
  Administered 2020-03-31: 400 mg via ORAL
  Filled 2020-03-31: qty 1

## 2020-03-31 NOTE — Discharge Instructions (Addendum)

## 2020-03-31 NOTE — ED Triage Notes (Signed)
Pt reports back pain for past 2 days after twisting motion. Unrelieved by heating pad, hot shower, or Alieve (last taken at 1800 yesterday). Previous back pain had been relieved by these methods. No reports of loss of bladder or bowel continence, no numbness or tingling

## 2020-03-31 NOTE — ED Provider Notes (Signed)
[facility] EMERGENCY DEPARTMENT Provider Note   CSN: [id number] Arrival date & time: 03/31/20  6301     History Chief Complaint  Patient presents with  . Back Pain    Jacqueline Wagner is a 29 y.o. female.  The history is provided by the patient.  Back Pain Location:  Lumbar spine Quality:  Stiffness and aching Radiates to:  R thigh Pain severity:  Severe Onset quality:  Sudden Duration:  1 day Timing:  Constant Progression:  Worsening Chronicity:  New Context: twisting   Context: not falling   Relieved by:  Nothing Worsened by:  Ambulation and bending Associated symptoms: no abdominal pain, no bladder incontinence, no bowel incontinence, no dysuria, no fever, no numbness and no weakness   Patient with history of anemia presents with back pain. She reports over the past day she has had worsening lower back pain is mostly on the right side. She reports at times it radiates to the right thigh. No weakness numbness in her legs. No incontinence. No falls or trauma. She does report recent twisting. She reports a week ago she received her COVID-19 vaccine because back pain but that improved No history of back surgery     Past Medical History:  Diagnosis Date  . Anemia   . UTI (urinary tract infection)     Patient Active Problem List   Diagnosis Date Noted  . S/P cesarean section 11/04/2019  . Pregnancy 11/03/2019  . IDA (iron deficiency anemia) 03/30/2019  . Menorrhagia 03/30/2019  . 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 CESAREAN SECTION N/A 11/04/2019   Procedure: CESAREAN SECTION;  Surgeon: Linda Hedges, DO;  Location: [facility] LD ORS;  Service: Obstetrics;  Laterality: N/A;  . NO PAST SURGERIES       OB History    Gravida  1   Para  1   Term  1   Preterm      AB      Living  1     SAB      TAB      Ectopic      Multiple  0   Live Births  1           Family  History  Problem Relation Age of Onset  . Hypertension Father   . Vitamin D deficiency Sister   . Stroke Paternal Grandmother   . Diabetes Paternal Grandfather     Social History   Tobacco Use  . Smoking status: Former Smoker    Packs/day: 0.50    Types: Cigarettes  . Smokeless tobacco: Never Used  Substance Use Topics  . Alcohol use: Not Currently  . Drug use: No    Home Medications Prior to Admission medications   Medication Sig Start Date End Date Taking? Authorizing Provider  acetaminophen (TYLENOL) 325 MG tablet Take 2 tablets (650 mg total) by mouth every 6 (six) hours as needed for mild pain (temperature > 101.5.). 11/06/19   Everlene Farrier, MD  amLODipine (NORVASC) 5 MG tablet Take 1 tablet (5 mg total) by mouth daily. 11/10/19   Gavin Pound, CNM  Ferrous Sulfate (IRON PO) Take 325 mg by mouth daily.     [provider]  Prenatal MV-Min-FA-Omega-3 (PRENATAL GUMMIES/DHA & FA) 0.4-32.5 MG CHEW Chew 1 tablet by mouth daily.     [provider]    Allergies    Papaya derivatives  Review of Systems  Review of Systems  Constitutional: Negative for fever.  Gastrointestinal: Negative for abdominal pain and bowel incontinence.  Genitourinary: Negative for bladder incontinence and dysuria.  Musculoskeletal: Positive for back pain.  Neurological: Negative for weakness and numbness.  All other systems reviewed and are negative.   Physical Exam Updated Vital Signs BP 118/61 (BP Location: Right Arm)   Pulse 76   Temp 98.6 F (37 C) (Oral)   Resp 16   Ht 1.626 m (5\' 4" )   Wt 103.9 kg   SpO2 100%   BMI 39.31 kg/m   Physical Exam CONSTITUTIONAL: Well developed/well nourished, uncomfortable. HEAD: Normocephalic/atraumatic EYES: EOMI/PERRL ENMT: Mucous membranes moist NECK: supple no meningeal signs SPINE/BACK:entire spine nontender no bruising/crepitance/stepoffs noted to spine, lumbar paraspinal tenderness, worse on the right CV: S1/S2 noted,  no murmurs/rubs/gallops noted LUNGS: Lungs are clear to auscultation bilaterally, no apparent distress ABDOMEN: soft, nontender, no rebound or guarding GU:no cva tenderness NEURO: Awake/alert, equal motor strength noted with the following (but limited due to pain): hip flexion/knee flexion/extension, foot dorsi/plantar flexion, great toe extension intact bilaterally, no clonus bilaterally, plantar reflex appropriate (toes downgoing), no sensory deficit in any dermatome.  Equal patellar/achilles reflex noted  in bilateral lower extremities.   EXTREMITIES: pulses normal, full ROM SKIN: warm, color normal PSYCH: no abnormalities of mood noted, alert and oriented to situation   ED Results / Procedures / Treatments   Labs (all labs ordered are listed, but only abnormal results are displayed) Labs Reviewed  URINALYSIS, ROUTINE W REFLEX MICROSCOPIC  PREGNANCY, URINE    EKG None  Radiology No results found.  Procedures Procedures   Medications Ordered in ED Medications  cyclobenzaprine (FLEXERIL) tablet 10 mg (10 mg Oral Given 03/31/20 0651)  ibuprofen (ADVIL) tablet 400 mg (400 mg Oral Given 03/31/20 04/02/20)    ED Course  I have reviewed the triage vital signs and the nursing notes.  Pertinent labs  results that were available during my care of the patient were reviewed by me and considered in my medical decision making (see chart for details).    MDM Rules/Calculators/A&P                      6:58 AM Suspect patient has lumbosacral radiculopathy No neuro deficits Will treat with muscle relaxant at patient request Signed out to dr [id number] to reassess patient and f/u on urinalysis  Final Clinical Impression(s) / ED Diagnoses Final diagnoses:  Acute right-sided low back pain with right-sided sciatica    Rx / DC Orders ED Discharge Orders    None       Lockie Mola, MD 03/31/20 0700

## 2020-03-31 NOTE — ED Provider Notes (Signed)
Patient signed out to me awaiting pregnancy test and urinalysis.  Low back pain.  No concerning features.  Patient has gotten Flexeril.  Pregnancy test is negative.  Urinalysis with possible infection will treat with Keflex.  Given prescription for Flexeril.  Recommend Tylenol Motrin for pain.  Discharged in good condition.  This chart was dictated using voice recognition software.  Despite best efforts to proofread,  errors can occur which can change the documentation meaning.     Virgina Norfolk, DO 03/31/20 [phone]

## 2020-05-23 DIAGNOSIS — K219 Gastro-esophageal reflux disease without esophagitis: Secondary | ICD-10-CM | POA: Diagnosis not present

## 2020-05-23 DIAGNOSIS — R05 Cough: Secondary | ICD-10-CM | POA: Diagnosis not present

## 2020-05-23 DIAGNOSIS — Z3200 Encounter for pregnancy test, result unknown: Secondary | ICD-10-CM | POA: Diagnosis not present

## 2020-06-04 ENCOUNTER — Other Ambulatory Visit: Payer: Self-pay

## 2020-06-04 ENCOUNTER — Encounter: Payer: Self-pay | Admitting: Podiatry

## 2020-06-04 ENCOUNTER — Ambulatory Visit: Payer: BC Managed Care – PPO | Admitting: Podiatry

## 2020-06-04 DIAGNOSIS — S90221A Contusion of right lesser toe(s) with damage to nail, initial encounter: Secondary | ICD-10-CM | POA: Diagnosis not present

## 2020-06-04 DIAGNOSIS — L03031 Cellulitis of right toe: Secondary | ICD-10-CM | POA: Diagnosis not present

## 2020-06-04 NOTE — Progress Notes (Signed)
  Subjective:  Patient ID: Jacqueline Wagner, female    DOB: [birth year],  MRN: [id number] HPI Chief Complaint  Patient presents with  . Nail Problem    5th toenail right - darkened nail at base, whole toe was swollen few days ago, doesn't remember an injury, not sore, but sometimes certain shoes cause tenderness  . New Patient (Initial Visit)    29 y.o. female presents with the above complaint.   ROS: Denies fever chills nausea vomiting muscle aches pains calf pain back pain chest pain shortness of breath.  Past Medical History:  Diagnosis Date  . Anemia   . UTI (urinary tract infection)    Past Surgical History:  Procedure Laterality Date  . CESAREAN SECTION N/A 11/04/2019   Procedure: CESAREAN SECTION;  Surgeon: Mitchel Honour, DO;  Location: [facility];  Service: Obstetrics;  Laterality: N/A;  . NO PAST SURGERIES      Current Outpatient Medications:  Marland Kitchen  Multiple Vitamin (MULTIVITAMIN) capsule, Take 1 capsule by mouth daily., Disp: , Rfl:  .  cefdinir (OMNICEF) 300 MG capsule, Take 300 mg by mouth 2 (two) times daily., Disp: , Rfl:  .  omeprazole (PRILOSEC) 40 MG capsule, Take 40 mg by mouth daily., Disp: , Rfl:  .  predniSONE (STERAPRED UNI-PAK 21 TAB) 10 MG (21) TBPK tablet, Take by mouth., Disp: , Rfl:   Allergies  Allergen Reactions  . Papaya Derivatives Hives   Review of Systems Objective:  There were no vitals filed for this visit.  General: Well developed, nourished, in no acute distress, alert and oriented x3   Dermatological: Skin is warm, dry and supple bilateral. Nails x 10 are well maintained; remaining integument appears unremarkable at this time. There are no open sores, no preulcerative lesions, no rash or signs of infection present.  Fifth toe right foot demonstrates no erythema edema cellulitis drainage or odor though does demonstrate a dark line in the absence of an eponychia most likely secondary to a nail salon's pushing back the cuticle.  This is left a  small hematoma around the proximal nail fold.  This does not appear to be problematic at this point.  I think the antibiotic that she is on is resolving the mild paronychia and it was associated with this procedure.  Vascular: Dorsalis Pedis artery and Posterior Tibial artery pedal pulses are 2/4 bilateral with immedate capillary fill time. Pedal hair growth present. No varicosities and no lower extremity edema present bilateral.   Neruologic: Grossly intact via light touch bilateral. Vibratory intact via tuning fork bilateral. Protective threshold with Semmes Wienstein monofilament intact to all pedal sites bilateral. Patellar and Achilles deep tendon reflexes 2+ bilateral. No Babinski or clonus noted bilateral.   Musculoskeletal: No gross boney pedal deformities bilateral. No pain, crepitus, or limitation noted with foot and ankle range of motion bilateral. Muscular strength 5/5 in all groups tested bilateral.  Gait: Unassisted, Nonantalgic.    Radiographs:  None taken  Assessment & Plan:   Assessment: Paronychia resolving  Plan: She is currently taking cefdinir for bronchitis I think that that has resolved the paronychia of her right toe.  I instructed her to soak Epson salts and warm water should there be any recurrence he will notify us immediately.  She is moving to [place] next week.     Ireoluwa Gorsline T. [place], [place]

## 2020-06-06 ENCOUNTER — Ambulatory Visit: Payer: BC Managed Care – PPO | Admitting: Podiatry

## 2020-11-17 IMAGING — US US EXTREM LOW VENOUS*R*
1 series · 14 of 24 positions shown · non-contrast
Comparison: None.

CLINICAL DATA: Leg pain.

EXAM:
RIGHT LOWER EXTREMITY VENOUS DOPPLER ULTRASOUND
TECHNIQUE: Gray-scale sonography with compression, as well as color and duplex
ultrasound, were performed to evaluate the deep venous system(s)
from the level of the common femoral vein through the popliteal and
proximal calf veins.

[Series 1: us extrem low venous*right* · 14 of 33 slices shown]
[im 1/33]
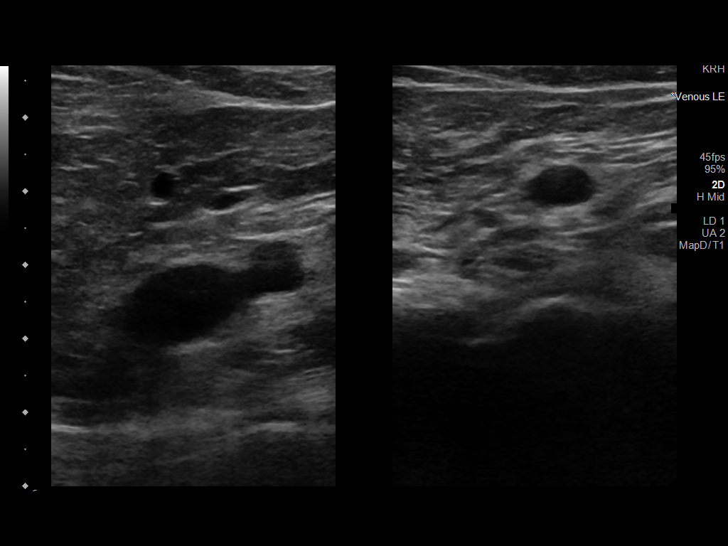
[im 3/33]
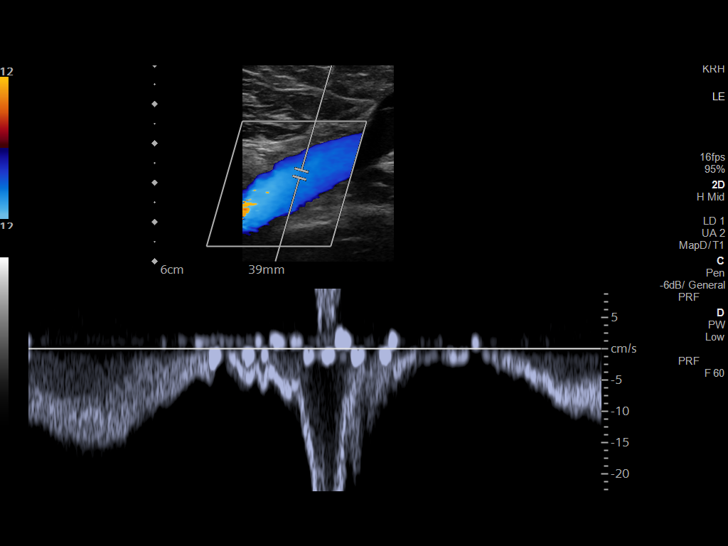
[im 6/33]
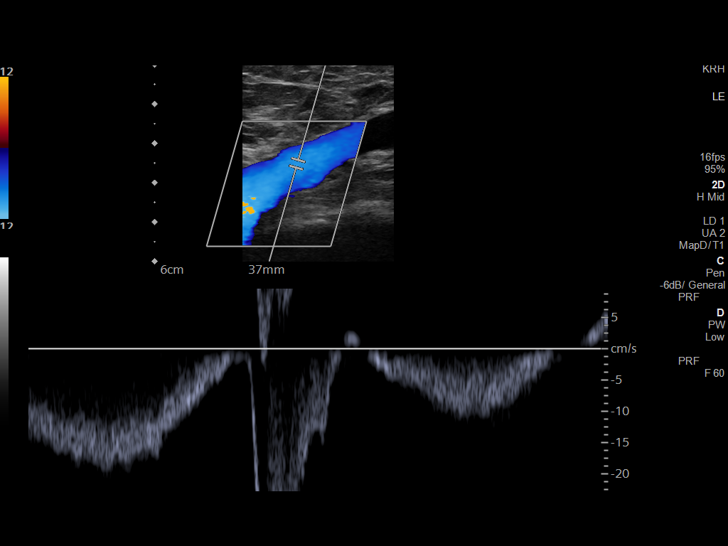
[im 9/33]
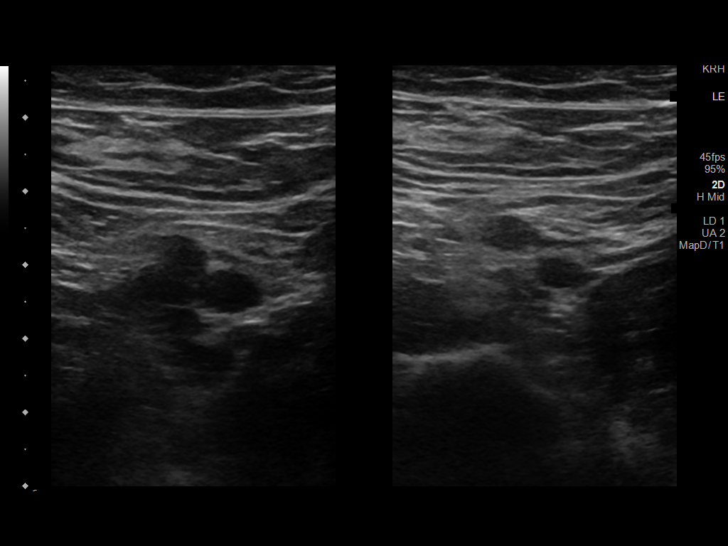
[im 10/33]
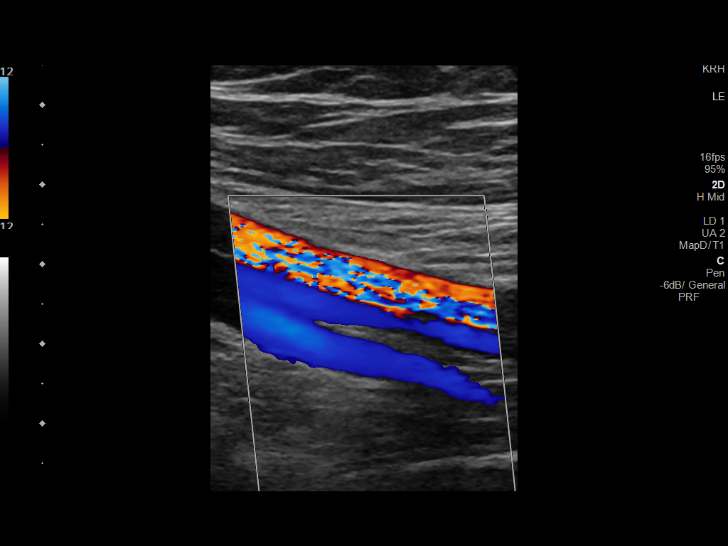
[im 13/33]
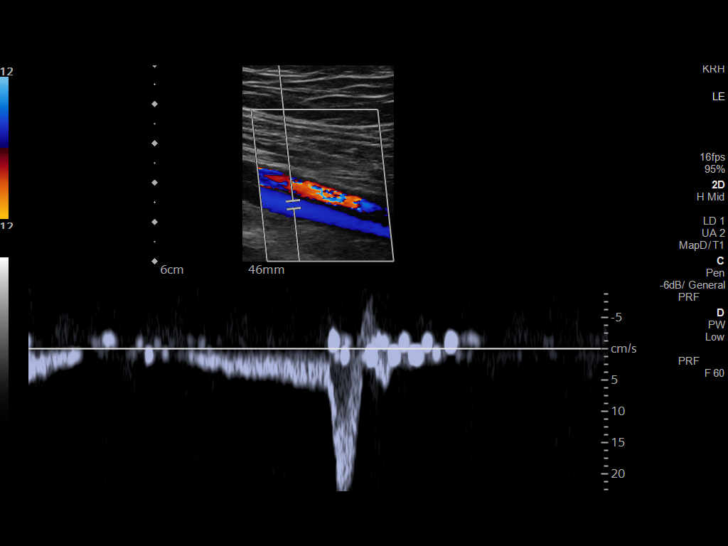
[im 16/33]
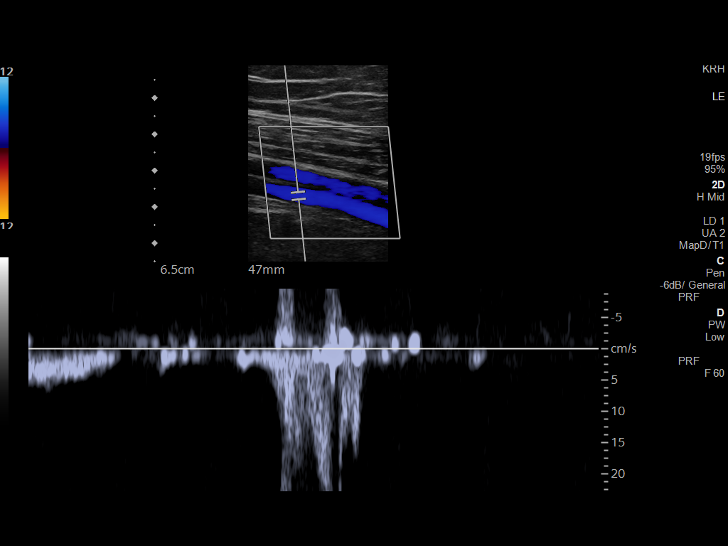
[im 17/33]
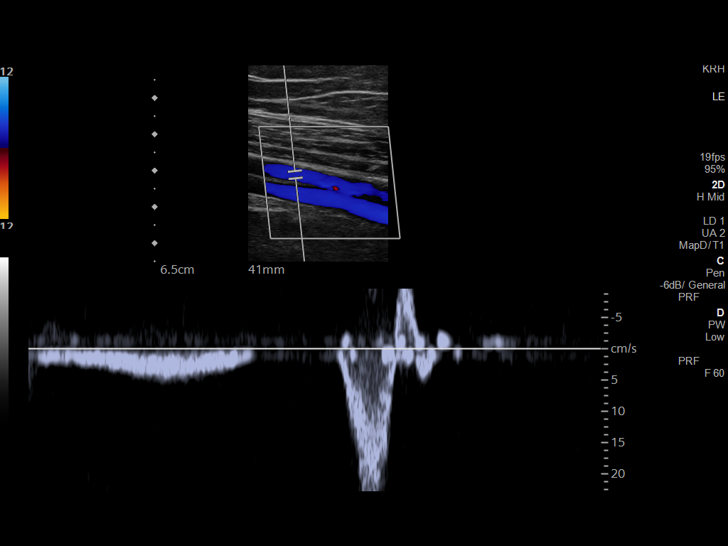
[im 20/33]
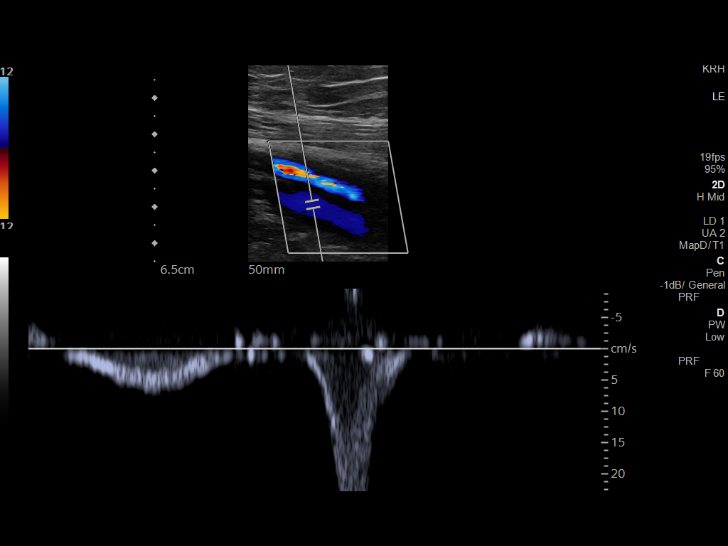
[im 23/33]
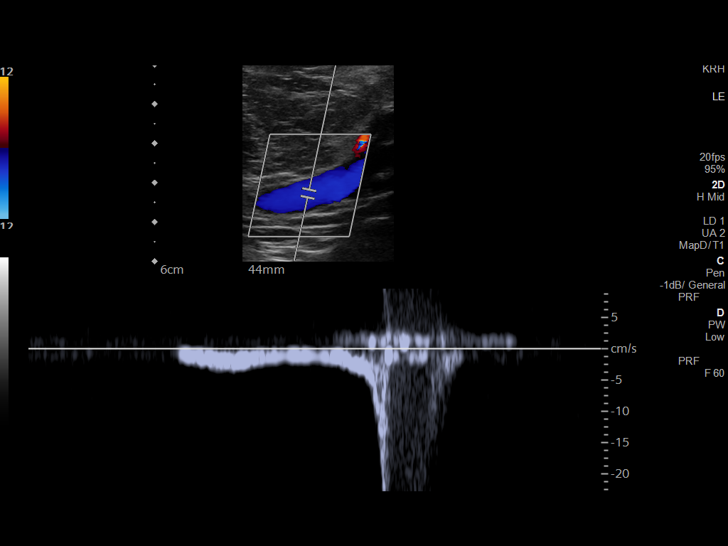
[im 26/33]
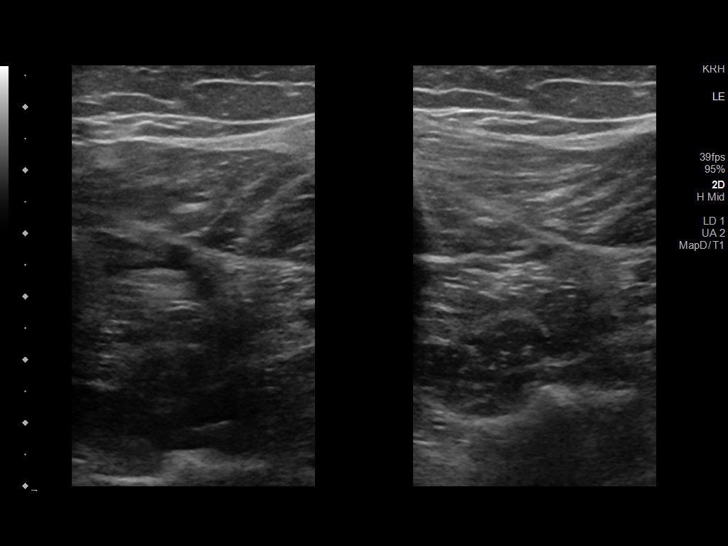
[im 27/33]
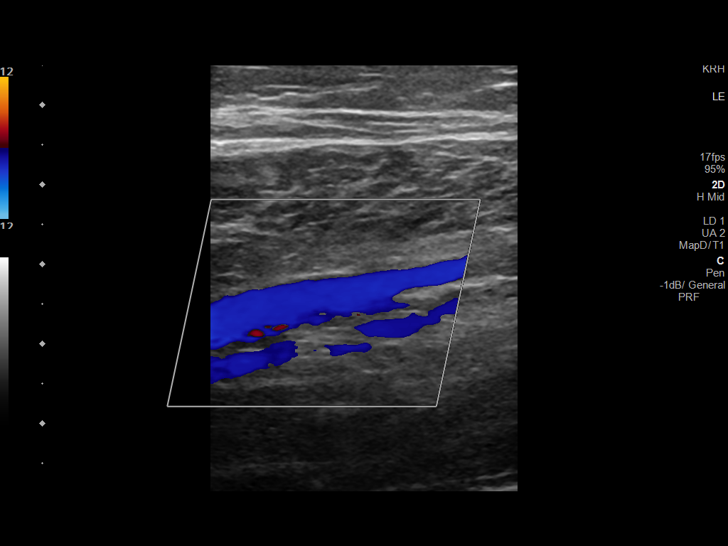
[im 30/33]
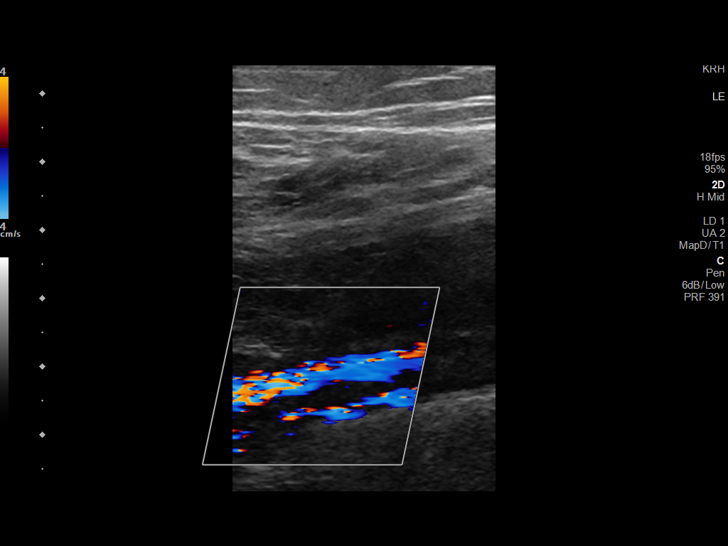
[im 33/33]
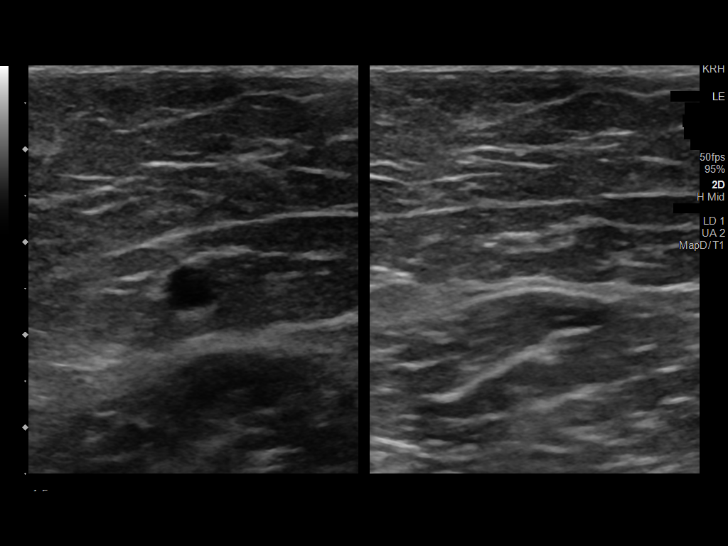

[14 of 24 positions shown; findings below may reference images not displayed]

FINDINGS: VENOUS

Normal compressibility of the common femoral, superficial femoral,
and popliteal veins, as well as the visualized calf veins.
Visualized portions of profunda femoral vein and great saphenous
vein unremarkable. No filling defects to suggest DVT on grayscale or
color Doppler imaging. Doppler waveforms show normal direction of
venous flow, normal respiratory phasicity and response to
augmentation.

Limited views of the contralateral common femoral vein are
unremarkable.

OTHER

None.

Limitations: none
IMPRESSION: No femoropopliteal DVT nor evidence of DVT within the visualized
calf veins.

If clinical symptoms are inconsistent or if there are persistent or
worsening symptoms, further imaging (possibly involving the iliac
veins) may be warranted.

## 2022-08-28 DIAGNOSIS — Z1331 Encounter for screening for depression: Secondary | ICD-10-CM | POA: Diagnosis not present

## 2022-08-28 DIAGNOSIS — D500 Iron deficiency anemia secondary to blood loss (chronic): Secondary | ICD-10-CM | POA: Diagnosis not present

## 2022-08-28 DIAGNOSIS — D509 Iron deficiency anemia, unspecified: Secondary | ICD-10-CM | POA: Diagnosis not present

## 2022-09-14 ENCOUNTER — Other Ambulatory Visit: Payer: Self-pay | Admitting: Family

## 2022-09-14 DIAGNOSIS — D649 Anemia, unspecified: Secondary | ICD-10-CM

## 2022-09-15 ENCOUNTER — Inpatient Hospital Stay: Payer: BC Managed Care – PPO

## 2022-09-15 ENCOUNTER — Inpatient Hospital Stay: Payer: BC Managed Care – PPO | Attending: Family | Admitting: Family

## 2022-10-23 ENCOUNTER — Other Ambulatory Visit: Payer: Self-pay

## 2022-10-23 ENCOUNTER — Encounter: Payer: Self-pay | Admitting: Hematology & Oncology

## 2022-10-23 ENCOUNTER — Inpatient Hospital Stay (HOSPITAL_BASED_OUTPATIENT_CLINIC_OR_DEPARTMENT_OTHER): Payer: BC Managed Care – PPO | Admitting: Hematology & Oncology

## 2022-10-23 ENCOUNTER — Inpatient Hospital Stay: Payer: BC Managed Care – PPO | Attending: Hematology & Oncology

## 2022-10-23 VITALS — BP 116/73 | HR 80 | Temp 98.4°F | Resp 18 | Ht 64.0 in | Wt 229.0 lb

## 2022-10-23 DIAGNOSIS — D509 Iron deficiency anemia, unspecified: Secondary | ICD-10-CM | POA: Insufficient documentation

## 2022-10-23 DIAGNOSIS — Z87891 Personal history of nicotine dependence: Secondary | ICD-10-CM | POA: Diagnosis not present

## 2022-10-23 DIAGNOSIS — N921 Excessive and frequent menstruation with irregular cycle: Secondary | ICD-10-CM | POA: Insufficient documentation

## 2022-10-23 DIAGNOSIS — D510 Vitamin B12 deficiency anemia due to intrinsic factor deficiency: Secondary | ICD-10-CM

## 2022-10-23 DIAGNOSIS — D500 Iron deficiency anemia secondary to blood loss (chronic): Secondary | ICD-10-CM

## 2022-10-23 LAB — CBC WITH DIFFERENTIAL (CANCER CENTER ONLY)
Abs Immature Granulocytes: 0.03 10*3/uL (ref 0.00–0.07)
Basophils Absolute: 0 10*3/uL (ref 0.0–0.1)
Basophils Relative: 0 %
Eosinophils Absolute: 0.2 10*3/uL (ref 0.0–0.5)
Eosinophils Relative: 2 %
HCT: 32.1 % — ABNORMAL LOW (ref 36.0–46.0)
Hemoglobin: 9.1 g/dL — ABNORMAL LOW (ref 12.0–15.0)
Immature Granulocytes: 0 %
Lymphocytes Relative: 41 %
Lymphs Abs: 4 10*3/uL (ref 0.7–4.0)
MCH: 18 pg — ABNORMAL LOW (ref 26.0–34.0)
MCHC: 28.3 g/dL — ABNORMAL LOW (ref 30.0–36.0)
MCV: 63.4 fL — ABNORMAL LOW (ref 80.0–100.0)
Monocytes Absolute: 0.4 10*3/uL (ref 0.1–1.0)
Monocytes Relative: 4 %
Neutro Abs: 5 10*3/uL (ref 1.7–7.7)
Neutrophils Relative %: 53 %
Platelet Count: 415 10*3/uL — ABNORMAL HIGH (ref 150–400)
RBC: 5.06 MIL/uL (ref 3.87–5.11)
RDW: 19.4 % — ABNORMAL HIGH (ref 11.5–15.5)
Smear Review: NORMAL
WBC Count: 9.6 10*3/uL (ref 4.0–10.5)
nRBC: 0 % (ref 0.0–0.2)

## 2022-10-23 LAB — CMP (CANCER CENTER ONLY)
ALT: 17 U/L (ref 0–44)
AST: 16 U/L (ref 15–41)
Albumin: 4.4 g/dL (ref 3.5–5.0)
Alkaline Phosphatase: 55 U/L (ref 38–126)
Anion gap: 8 (ref 5–15)
BUN: 12 mg/dL (ref 6–20)
CO2: 24 mmol/L (ref 22–32)
Calcium: 9.2 mg/dL (ref 8.9–10.3)
Chloride: 106 mmol/L (ref 98–111)
Creatinine: 0.73 mg/dL (ref 0.44–1.00)
GFR, Estimated: >60 mL/min (ref >60)
Glucose, Bld: 96 mg/dL (ref 70–99)
Potassium: 3.8 mmol/L (ref 3.5–5.1)
Sodium: 138 mmol/L (ref 135–145)
Total Bilirubin: 0.4 mg/dL (ref 0.3–1.2)
Total Protein: 7.9 g/dL (ref 6.5–8.1)

## 2022-10-23 LAB — RETICULOCYTES
Immature Retic Fract: 14 % (ref 2.3–15.9)
RBC.: 4.99 MIL/uL (ref 3.87–5.11)
Retic Count, Absolute: 63.4 10*3/uL (ref 19.0–186.0)
Retic Ct Pct: 1.3 % (ref 0.4–3.1)

## 2022-10-23 LAB — SAVE SMEAR(SSMR), FOR PROVIDER SLIDE REVIEW

## 2022-10-23 LAB — VITAMIN B12: Vitamin B-12: 185 pg/mL (ref 180–914)

## 2022-10-23 LAB — FERRITIN: Ferritin: 15 ng/mL (ref 11–307)

## 2022-10-23 NOTE — Progress Notes (Addendum)
Referral MD  Reason for Referral: Iron deficiency anemia-likely menorrhagia  Chief Complaint  Patient presents with   New Patient (Initial Visit)  : I just moved up here from [place].  HPI: Jacqueline Wagner is very nice.  She is a very charming 31 year old partnered Filipino woman.  She has been down in [place].  She recently moved back up here.  She has her family down in [place].  Her husband has his family appear.  She has iron deficiency.  The last notes that I have on her from a peer were 3 years ago.  At that time, she had iron deficiency.  She had a baby girl.  Everything went well.  Down in [place], she has had iron deficiency.  She says she was transfused back in February.  At that time, she says that her hemoglobin was 7..  She does have menometrorrhagia.  She apparently had an IUD put in a couple months ago.  She says that she has had IV iron.  She said that she had a reaction to the last time she had iron.  She is not sure what kind she had.  She does feel tired.  She has had no obvious bleeding otherwise.  She has had no weight loss or weight gain.  She has had no problems with rashes.  There is been no leg swelling.  She has had no cough or shortness of breath..  She does have some fatigue.  She does have a craving for ice although she does not chew ice.  She used to smoke.  But stopped 10 years ago.  As far she knows, there is no one in the family that has history of iron deficiency.  There is no history of cancer in the family.  She just wants to feel better.  She is calmly referred to the [facility] for an evaluation as to the iron deficiency.  Currently, I would say that her performance status is probably ECOG 1.    Past Medical History:  Diagnosis Date   Anemia    UTI (urinary tract infection)   :   Past Surgical History:  Procedure Laterality Date   CESAREAN SECTION N/A 11/04/2019   Procedure: CESAREAN SECTION;  Surgeon: Mitchel Honour, DO;   Location: [facility];  Service: Obstetrics;  Laterality: N/A;   NO PAST SURGERIES    :   Current Outpatient Medications:    medroxyPROGESTERone (PROVERA) 10 MG tablet, Take 10 mg by mouth daily., Disp: , Rfl:    Polysaccharide-Iron Complex 150 MG CAPS, Take 150 mg by mouth 2 (two) times daily., Disp: , Rfl: :  :   Allergies  Allergen Reactions   Papaya Derivatives Hives  :   Family History  Problem Relation Age of Onset   Hypertension Father    Vitamin D deficiency Sister    Stroke Paternal Grandmother    Diabetes Paternal Grandfather   :   Social History   Socioeconomic History   Marital status: Single    Spouse name: Not on file   Number of children: Not on file   Years of education: Not on file   Highest education level: Not on file  Occupational History   Not on file  Tobacco Use   Smoking status: Former    Packs/day: 0.50    Types: Cigarettes   Smokeless tobacco: Never  Vaping Use   Vaping Use: Not on file  Substance and Sexual Activity   Alcohol use: Not  Currently   Drug use: No   Sexual activity: Yes    Birth control/protection: Condom  Other Topics Concern   Not on file  Social History Narrative   Not on file   Social Determinants of Health   Financial Resource Strain: Not on file  Food Insecurity: Not on file  Transportation Needs: Not on file  Physical Activity: Not on file  Stress: Not on file  Social Connections: Not on file  Intimate Partner Violence: Not on file  :  Review of Systems  Constitutional:  Positive for malaise/fatigue.  HENT: Negative.    Eyes: Negative.   Respiratory: Negative.    Cardiovascular: Negative.   Gastrointestinal: Negative.   Genitourinary: Negative.   Musculoskeletal: Negative.   Skin: Negative.   Neurological: Negative.   Endo/Heme/Allergies: Negative.   Psychiatric/Behavioral: Negative.       Exam: Vital signs are temperature of 98.4.  Pulse 80.  Blood pressure 116/73.  Weight is 229  pounds.  @IPVITALS @ Physical Exam Vitals reviewed.  HENT:     Head: Normocephalic and atraumatic.  Eyes:     Pupils: Pupils are equal, round, and reactive to light.  Cardiovascular:     Rate and Rhythm: Normal rate and regular rhythm.     Heart sounds: Normal heart sounds.  Pulmonary:     Effort: Pulmonary effort is normal.     Breath sounds: Normal breath sounds.  Abdominal:     General: Bowel sounds are normal.     Palpations: Abdomen is soft.  Musculoskeletal:        General: No tenderness or deformity. Normal range of motion.     Cervical back: Normal range of motion.  Lymphadenopathy:     Cervical: No cervical adenopathy.  Skin:    General: Skin is warm and dry.     Findings: No erythema or rash.  Neurological:     Mental Status: She is alert and oriented to person, place, and time.  Psychiatric:        Behavior: Behavior normal.        Thought Content: Thought content normal.        Judgment: Judgment normal.     Recent Labs    10/23/22 1338  WBC 9.6  HGB 9.1*  HCT 32.1*  PLT 415*    Recent Labs    10/23/22 1338  NA 138  K 3.8  CL 106  CO2 24  GLUCOSE 96  BUN 12  CREATININE 0.73  CALCIUM 9.2    Blood smear review: Moderate anisocytosis and poikilocytosis.  She has microcytic red blood cells.  She has hypochromic red blood cells.  I see no nucleated red blood cells.  She has a couple ghost cells.  There are no target cells.  There may be a couple schistocytes.  I see no polychromasia.  There is no rouleaux formation.  White blood cells appear normal in morphology and maturation.  She has no hypersegmented polys.  She has no immature myeloid or lymphoid cells.  Platelets are adequate number and size.  She has a few large platelets.  Platelets are well granulated.  Pathology: None    Assessment and Plan: Jacqueline Wagner is a very charming 31 year old female.  She is part of [place].  It was [place] to [place] about [place]  Her mom is [place]  and her father is Naval architect.  They met while he was in the [place] the stationed in the [place]  I do believe that she is  going to have marked iron deficiency.  Her MCV is incredibly low.  She may have a hemoglobinopathy given that she is part [place].  There might be Hemoglobin E which is quite prevalent in [place].  However, I do not see anything on the blood smear that would suggest this.  Even though she has not had a cycle in a couple months, I suspect that she probably still iron deficient.  Again, we will have to see what her iron studies look like.  I would like to hope that we can give her Monoferric.  As I like she had a reaction to Venofer or to [place].  We may have to contact her hematologist down to [place] to find out what she had a reaction to.  I would like to think that we should be able to get her anemia improved.  We should be able to get her MCV elevated.  Her reticulocyte count is quite low.  As such, she is not hemolyzing.  She is quite delightful to talk to.  I just want her to be able to have a good quality of life.  I want to be able to enjoy her 24-year-old.  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  We will plan to get her back once we get the results back and then figure out how we can give her the IV iron, which I believe she will need.  ADDENDUM: Her iron studies clearly show marked iron deficiency.  Her ferritin is 15 with an iron saturation of 3%.  Her erythropoietin level is 30.  We will try to give her Monoferric.  I think she had a reaction to [place] and Venofer down in [place].  We will go ahead and premedicate her with Tylenol, Pepcid, and Solu-Medrol.  We will plan to get her back to see [place] in about a month or so.

## 2022-10-24 LAB — ERYTHROPOIETIN: Erythropoietin: 30.5 m[IU]/mL — ABNORMAL HIGH (ref 2.6–18.5)

## 2022-10-26 ENCOUNTER — Encounter: Payer: Self-pay | Admitting: Hematology & Oncology

## 2022-10-26 LAB — HGB FRACTIONATION CASCADE
Hgb A2: 1.9 % (ref 1.8–3.2)
Hgb A: 98.1 % (ref 96.4–98.8)
Hgb F: 0 % (ref 0.0–2.0)
Hgb S: 0 %

## 2022-10-26 LAB — IRON AND IRON BINDING CAPACITY (CC-WL,HP ONLY)
Iron: 15 ug/dL — ABNORMAL LOW (ref 28–170)
Saturation Ratios: 3 % — ABNORMAL LOW (ref 10.4–31.8)
TIBC: 487 ug/dL — ABNORMAL HIGH (ref 250–450)
UIBC: 472 ug/dL — ABNORMAL HIGH (ref 148–442)

## 2022-10-26 NOTE — Addendum Note (Signed)
Addended by: Arlan Organ R on: 10/26/2022 02:05 PM   Modules accepted: Orders

## 2022-11-03 ENCOUNTER — Inpatient Hospital Stay: Payer: BC Managed Care – PPO

## 2022-11-03 VITALS — BP 115/71 | HR 83 | Resp 18

## 2022-11-03 DIAGNOSIS — D509 Iron deficiency anemia, unspecified: Secondary | ICD-10-CM | POA: Diagnosis not present

## 2022-11-03 DIAGNOSIS — N921 Excessive and frequent menstruation with irregular cycle: Secondary | ICD-10-CM | POA: Diagnosis not present

## 2022-11-03 DIAGNOSIS — D500 Iron deficiency anemia secondary to blood loss (chronic): Secondary | ICD-10-CM

## 2022-11-03 DIAGNOSIS — Z87891 Personal history of nicotine dependence: Secondary | ICD-10-CM | POA: Diagnosis not present

## 2022-11-03 MED ORDER — FAMOTIDINE IN NACL 20-0.9 MG/50ML-% IV SOLN
20.0000 mg | Freq: Once | INTRAVENOUS | Status: AC
  Administered 2022-11-03: 20 mg via INTRAVENOUS
  Filled 2022-11-03: qty 50

## 2022-11-03 MED ORDER — METHYLPREDNISOLONE SODIUM SUCC 125 MG IJ SOLR
125.0000 mg | Freq: Once | INTRAMUSCULAR | Status: AC
  Administered 2022-11-03: 125 mg via INTRAVENOUS
  Filled 2022-11-03: qty 2

## 2022-11-03 MED ORDER — ACETAMINOPHEN 325 MG PO TABS
650.0000 mg | ORAL_TABLET | Freq: Once | ORAL | Status: AC
  Administered 2022-11-03: 650 mg via ORAL
  Filled 2022-11-03: qty 2

## 2022-11-03 MED ORDER — SODIUM CHLORIDE 0.9 % IV SOLN
1000.0000 mg | Freq: Once | INTRAVENOUS | Status: AC
  Administered 2022-11-03: 1000 mg via INTRAVENOUS
  Filled 2022-11-03: qty 10

## 2022-11-03 MED ORDER — SODIUM CHLORIDE 0.9 % IV SOLN: Freq: Once | INTRAVENOUS | Status: AC

## 2022-11-03 NOTE — Patient Instructions (Signed)

## 2022-11-19 ENCOUNTER — Other Ambulatory Visit: Payer: BC Managed Care – PPO

## 2022-11-19 ENCOUNTER — Ambulatory Visit: Payer: BC Managed Care – PPO | Admitting: Medical Oncology

## 2022-12-04 ENCOUNTER — Encounter (HOSPITAL_BASED_OUTPATIENT_CLINIC_OR_DEPARTMENT_OTHER): Payer: Self-pay

## 2022-12-04 ENCOUNTER — Emergency Department (HOSPITAL_BASED_OUTPATIENT_CLINIC_OR_DEPARTMENT_OTHER)
Admission: EM | Admit: 2022-12-04 | Discharge: 2022-12-04 | Disposition: A | Discharge status: Home / Self Care | Payer: BC Managed Care – PPO | Attending: Emergency Medicine | Admitting: Emergency Medicine

## 2022-12-04 ENCOUNTER — Emergency Department (HOSPITAL_BASED_OUTPATIENT_CLINIC_OR_DEPARTMENT_OTHER): Payer: BC Managed Care – PPO

## 2022-12-04 ENCOUNTER — Other Ambulatory Visit: Payer: Self-pay

## 2022-12-04 DIAGNOSIS — R519 Headache, unspecified: Secondary | ICD-10-CM | POA: Diagnosis not present

## 2022-12-04 DIAGNOSIS — M542 Cervicalgia: Secondary | ICD-10-CM | POA: Insufficient documentation

## 2022-12-04 NOTE — ED Provider Notes (Signed)
[facility] EMERGENCY DEPARTMENT Provider Note   CSN: [id number] Arrival date & time: 12/04/22  1714     History Chief Complaint  Patient presents with   Headache    HPI Jacqueline Wagner is a 31 y.o. female presenting for headache.  She states that she woke up from sleep on Monday with a sharp pain in her neck that came and then improved.  She states she has had intermittent headaches over the week this week all radiating from her right neck. She denies fevers or chills, nausea vomiting, syncope or shortness of breath.  She is otherwise ambulatory tolerating p.o. intake.  No known sick contacts.  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 Symptoms currently resolved.  Patient's recorded medical, surgical, social, medication list and allergies were reviewed in the Snapshot window as part of the initial history.   Review of Systems   Review of Systems  Constitutional:  Negative for chills and fever.  HENT:  Negative for ear pain and sore throat.   Eyes:  Negative for pain and visual disturbance.  Respiratory:  Negative for cough and shortness of breath.   Cardiovascular:  Negative for chest pain and palpitations.  Gastrointestinal:  Negative for abdominal pain and vomiting.  Genitourinary:  Negative for dysuria and hematuria.  Musculoskeletal:  Positive for neck pain. Negative for arthralgias and back pain.  Skin:  Negative for color change and rash.  Neurological:  Positive for headaches. Negative for seizures and syncope.  All other systems reviewed and are negative.   Physical Exam Updated Vital Signs BP 136/79   Pulse 100   Temp 98.2 F (36.8 C) (Oral)   Resp 18   Ht 5\' 4"  (1.626 m)   Wt 104.3 kg   LMP 11/18/2022   SpO2 100%   BMI 39.48 kg/m  Physical Exam Vitals and nursing note reviewed.  Constitutional:      General: She is not in acute distress.    Appearance: She is well-developed.  HENT:     Head:  Normocephalic and atraumatic.  Eyes:     Conjunctiva/sclera: Conjunctivae normal.  Cardiovascular:     Rate and Rhythm: Normal rate and regular rhythm.     Heart sounds: No murmur heard. Pulmonary:     Effort: Pulmonary effort is normal. No respiratory distress.     Breath sounds: Normal breath sounds.  Abdominal:     General: There is no distension.     Palpations: Abdomen is soft.     Tenderness: There is no abdominal tenderness. There is no right CVA tenderness or left CVA tenderness.  Musculoskeletal:        General: No swelling or tenderness. Normal range of motion.     Cervical back: Neck supple.  Skin:    General: Skin is warm and dry.  Neurological:     General: No focal deficit present.     Mental Status: She is alert and oriented to person, place, and time. Mental status is at baseline.     Cranial Nerves: No cranial nerve deficit.      ED Course/ Medical Decision Making/ A&P    Procedures Procedures   Medications Ordered in ED Medications - No data to display Medical Decision Making:   Chidera Dearcos is a 31 y.o. female who presented to the ED today with intermittent headaches detailed above.    Patient's presentation is complicated by their history of elevated BMI.  Patient placed on continuous vitals and telemetry monitoring while in ED which was reviewed periodically.   Complete initial physical exam performed, notably the patient  was hemodynamically stable no acute distress.    Reviewed and confirmed nursing documentation for past medical history, family history, social history.    Initial Assessment:   With the patient's presentation of headache, most likely diagnosis is musculoskeletal etiology versus tension type headaches vs atypical migraines. Other diagnoses were considered including (but not limited to) intracranial mass, intracranial hemorrhage, intracranial infection including meningitis vs encephalitis, GCA, trigeminal neuralgia. These are  considered less likely due to history of present illness and physical exam findings.    This is most consistent with an acute life/limb threatening illness complicated by underlying chronic conditions.   Timeline and slow onset is not consistent with SAH/ICH   Age and description of pain is not consistent with GCA   Lack of fever,meningismus is not consistent with meningitis/encephalitis   Initial Plan:  Will initiate treatment with NSAIDS/Tylenol for treatment of nonspecific headache  CTH to evaluate for structural IC etiology  Objective evaluation as below reviewed   Initial Study Results:    Radiology:  All images reviewed independently. Agree with radiology report at this time.   CT HEAD WO CONTRAST ( )  Result Date: 12/04/2022 CLINICAL DATA:  Headaches EXAM: CT HEAD WITHOUT CONTRAST TECHNIQUE: Contiguous axial images were obtained from the base of the skull through the vertex without intravenous contrast. RADIATION DOSE REDUCTION: This exam was performed according to the departmental dose-optimization program which includes automated exposure control, adjustment of the mA and/or kV according to patient size and/or use of iterative reconstruction technique. COMPARISON:  None Available. FINDINGS: Brain: No acute intracranial findings are seen. There are no signs of bleeding within the cranium. Ventricles are not dilated. Cortical sulci are prominent suggesting atrophy. There is no focal edema or mass effect. Vascular: Unremarkable. Skull: Unremarkable. Sinuses/Orbits: Unremarkable. Other: None. IMPRESSION: No acute intracranial findings are seen. There is prominence of cortical sulci suggesting atrophy. Electronically Signed   By: Ernie Avena M.D.   On: 12/04/2022 18:53   .   Final Assessment and Plan:   Patient's objective findings are reassuring.  She was observed in the emergency department with no focal pathology.  Her description is more consistent with musculoskeletal  etiology. Disposition:  I have considered need for hospitalization, however, considering all of the above, I believe this patient is stable for discharge at this time.  Patient/family educated about specific return precautions for given chief complaint and symptoms.  Patient/family educated about follow-up with PCP.     Patient/family expressed understanding of return precautions and need for follow-up. Patient spoken to regarding all imaging and laboratory results and appropriate follow up for these results. All education provided in verbal form with additional information in written form. Time was allowed for answering of patient questions. Patient discharged.    Emergency Department Medication Summary:   Medications - No data to display   Clinical Impression:  1. Acute nonintractable headache, unspecified headache type      Data Unavailable   Final Clinical Impression(s) / ED Diagnoses Final diagnoses:  Acute nonintractable headache, unspecified headache type    Rx / DC Orders ED Discharge Orders     None         Glyn Ade, MD 12/04/22 2104

## 2022-12-04 NOTE — ED Triage Notes (Signed)
Pt presents with HA that is described as a localized area to the R occipital area. Pt describes it as a constant dull pain with an intermittent sharp stabbing pain. Pt has associated photophobia. Pt has taken APAP without relief. Pt states when the pain started it woke her from her sleep and she had nausea. No focal neuro deficits noted in triage.

## 2022-12-04 NOTE — ED Notes (Signed)
Dc instructions reviewed with pt no questions or concerns at this time. Will follow up with pcp 

## 2022-12-22 ENCOUNTER — Inpatient Hospital Stay (HOSPITAL_BASED_OUTPATIENT_CLINIC_OR_DEPARTMENT_OTHER): Payer: BC Managed Care – PPO | Admitting: Family

## 2022-12-22 ENCOUNTER — Inpatient Hospital Stay: Payer: BC Managed Care – PPO

## 2022-12-22 ENCOUNTER — Inpatient Hospital Stay: Payer: BC Managed Care – PPO | Attending: Hematology & Oncology

## 2022-12-22 ENCOUNTER — Inpatient Hospital Stay: Payer: BC Managed Care – PPO | Admitting: Medical Oncology

## 2022-12-22 ENCOUNTER — Encounter: Payer: Self-pay | Admitting: Family

## 2022-12-22 VITALS — BP 125/76 | HR 81 | Temp 98.6°F | Resp 17 | Wt 238.1 lb

## 2022-12-22 DIAGNOSIS — D509 Iron deficiency anemia, unspecified: Secondary | ICD-10-CM | POA: Diagnosis not present

## 2022-12-22 DIAGNOSIS — D500 Iron deficiency anemia secondary to blood loss (chronic): Secondary | ICD-10-CM

## 2022-12-22 LAB — CMP (CANCER CENTER ONLY)
ALT: 28 U/L (ref 0–44)
AST: 19 U/L (ref 15–41)
Albumin: 4.5 g/dL (ref 3.5–5.0)
Alkaline Phosphatase: 63 U/L (ref 38–126)
Anion gap: 10 (ref 5–15)
BUN: 9 mg/dL (ref 6–20)
CO2: 26 mmol/L (ref 22–32)
Calcium: 9 mg/dL (ref 8.9–10.3)
Chloride: 104 mmol/L (ref 98–111)
Creatinine: 0.7 mg/dL (ref 0.44–1.00)
GFR, Estimated: >60 mL/min (ref >60)
Glucose, Bld: 96 mg/dL (ref 70–99)
Potassium: 3.7 mmol/L (ref 3.5–5.1)
Sodium: 140 mmol/L (ref 135–145)
Total Bilirubin: 0.3 mg/dL (ref 0.3–1.2)
Total Protein: 7.9 g/dL (ref 6.5–8.1)

## 2022-12-22 LAB — CBC WITH DIFFERENTIAL (CANCER CENTER ONLY)
Abs Immature Granulocytes: 0.02 10*3/uL (ref 0.00–0.07)
Basophils Absolute: 0 10*3/uL (ref 0.0–0.1)
Basophils Relative: 0 %
Eosinophils Absolute: 0.2 10*3/uL (ref 0.0–0.5)
Eosinophils Relative: 2 %
HCT: 38 % (ref 36.0–46.0)
Hemoglobin: 11.6 g/dL — ABNORMAL LOW (ref 12.0–15.0)
Immature Granulocytes: 0 %
Lymphocytes Relative: 39 %
Lymphs Abs: 3.7 10*3/uL (ref 0.7–4.0)
MCH: 22.6 pg — ABNORMAL LOW (ref 26.0–34.0)
MCHC: 30.5 g/dL (ref 30.0–36.0)
MCV: 74.1 fL — ABNORMAL LOW (ref 80.0–100.0)
Monocytes Absolute: 0.5 10*3/uL (ref 0.1–1.0)
Monocytes Relative: 5 %
Neutro Abs: 5.1 10*3/uL (ref 1.7–7.7)
Neutrophils Relative %: 54 %
Platelet Count: 338 10*3/uL (ref 150–400)
RBC: 5.13 MIL/uL — ABNORMAL HIGH (ref 3.87–5.11)
RDW: 25.3 % — ABNORMAL HIGH (ref 11.5–15.5)
WBC Count: 9.5 10*3/uL (ref 4.0–10.5)
nRBC: 0 % (ref 0.0–0.2)

## 2022-12-22 LAB — RETICULOCYTES
Immature Retic Fract: 12.4 % (ref 2.3–15.9)
RBC.: 5.08 MIL/uL (ref 3.87–5.11)
Retic Count, Absolute: 52.8 10*3/uL (ref 19.0–186.0)
Retic Ct Pct: 1 % (ref 0.4–3.1)

## 2022-12-22 LAB — IRON AND IRON BINDING CAPACITY (CC-WL,HP ONLY)
Iron: 26 ug/dL — ABNORMAL LOW (ref 28–170)
Saturation Ratios: 7 % — ABNORMAL LOW (ref 10.4–31.8)
TIBC: 361 ug/dL (ref 250–450)
UIBC: 335 ug/dL (ref 148–442)

## 2022-12-22 LAB — FERRITIN: Ferritin: 80 ng/mL (ref 11–307)

## 2022-12-22 NOTE — Progress Notes (Signed)
Hematology and Oncology Follow Up Visit  Jacqueline Wagner [id number] Jul 29, 1991 32 y.o. 12/22/2022   Principle Diagnosis:  Iron deficiency anemia  Current Therapy:   IV iron as indicated    Interim History:  Jacqueline Wagner is here today for follow-up. She is doing well but still notes fatigue.  The numbness and tingling in her hands has resolved and she has not noted any more episdoes of dizziness.  She has an IUD in place.  No other blood loss noted. No bruising or petechiae.  No fever, chills, n/v, cough, rash, chest pain, palpitations, abdominal pain or changes in bowel or bladder habits at this time.  She notes swelling in her lower extremities that improves with propping up her feet.  No falls or syncope reported.  Appetite and hydration are good. Weight is stable at 238 lbs.   ECOG Performance Status: 1 - Symptomatic but completely ambulatory  Medications:  Allergies as of 12/22/2022       Reactions   Papaya Derivatives Hives        Medication List        Accurate as of December 22, 2022  1:28 PM. If you have any questions, ask your nurse or doctor.          medroxyPROGESTERone 10 MG tablet Commonly known as: PROVERA Take 10 mg by mouth daily.   Polysaccharide-Iron Complex 150 MG Caps Take 150 mg by mouth 2 (two) times daily.        Allergies:  Allergies  Allergen Reactions   Papaya Derivatives Hives    Past Medical History, Surgical history, Social history, and Family History were reviewed and updated.  Review of Systems: All other 10 point review of systems is negative.   Physical Exam:  vitals were not taken for this visit.   Wt Readings from Last 3 Encounters:  12/04/22 230 lb (104.3 kg)  10/23/22 229 lb (103.9 kg)  03/31/20 229 lb (103.9 kg)    Ocular: Sclerae unicteric, pupils equal, round and reactive to light Ear-nose-throat: Oropharynx clear, dentition fair Lymphatic: No cervical or supraclavicular adenopathy Lungs no rales or rhonchi,  good excursion bilaterally Heart regular rate and rhythm, no murmur appreciated Abd soft, nontender, positive bowel sounds MSK no focal spinal tenderness, no joint edema Neuro: non-focal, well-oriented, appropriate affect Breasts: Deferred   Lab Results  Component Value Date   WBC 9.6 10/23/2022   HGB 9.1 (L) 10/23/2022   HCT 32.1 (L) 10/23/2022   MCV 63.4 (L) 10/23/2022   PLT 415 (H) 10/23/2022   Lab Results  Component Value Date   FERRITIN 15 10/23/2022   IRON 15 (L) 10/23/2022   TIBC 487 (H) 10/23/2022   UIBC 472 (H) 10/23/2022   IRONPCTSAT 3 (L) 10/23/2022   Lab Results  Component Value Date   RETICCTPCT 1.3 10/23/2022   RBC 5.06 10/23/2022   RBC 4.99 10/23/2022   No results found for: "KPAFRELGTCHN", "LAMBDASER", "KAPLAMBRATIO" No results found for: "IGGSERUM", "IGA", "IGMSERUM" No results found for: "TOTALPROTELP", "ALBUMINELP", "A1GS", "A2GS", "BETS", "BETA2SER", "GAMS", "MSPIKE", "SPEI"   Chemistry      Component Value Date/Time   NA 138 10/23/2022 1338   K 3.8 10/23/2022 1338   CL 106 10/23/2022 1338   CO2 24 10/23/2022 1338   BUN 12 10/23/2022 1338   CREATININE 0.73 10/23/2022 1338      Component Value Date/Time   CALCIUM 9.2 10/23/2022 1338   ALKPHOS 55 10/23/2022 1338   AST 16 10/23/2022 1338   ALT 17 10/23/2022  1338   BILITOT 0.4 10/23/2022 1338       Impression and Plan: Jacqueline Wagner is a very pleasant 32 yo [place] female with iron deficiency anemia.  Iron studies are pending. We will replace if needed.  Follow-up in 3 months.   Lottie Dawson, NP 1/9/20241:28 PM

## 2022-12-25 ENCOUNTER — Other Ambulatory Visit: Payer: Self-pay | Admitting: Family

## 2022-12-28 ENCOUNTER — Inpatient Hospital Stay: Payer: BC Managed Care – PPO

## 2022-12-28 VITALS — BP 113/70 | HR 78 | Temp 98.5°F | Resp 20

## 2022-12-28 DIAGNOSIS — D509 Iron deficiency anemia, unspecified: Secondary | ICD-10-CM | POA: Diagnosis not present

## 2022-12-28 DIAGNOSIS — D500 Iron deficiency anemia secondary to blood loss (chronic): Secondary | ICD-10-CM

## 2022-12-28 MED ORDER — ACETAMINOPHEN 325 MG PO TABS
650.0000 mg | ORAL_TABLET | Freq: Once | ORAL | Status: AC
  Administered 2022-12-28: 650 mg via ORAL
  Filled 2022-12-28: qty 2

## 2022-12-28 MED ORDER — SODIUM CHLORIDE 0.9 % IV SOLN: Freq: Once | INTRAVENOUS | Status: AC

## 2022-12-28 MED ORDER — FAMOTIDINE IN NACL 20-0.9 MG/50ML-% IV SOLN
20.0000 mg | Freq: Once | INTRAVENOUS | Status: AC
  Administered 2022-12-28: 20 mg via INTRAVENOUS
  Filled 2022-12-28: qty 50

## 2022-12-28 MED ORDER — METHYLPREDNISOLONE SODIUM SUCC 125 MG IJ SOLR
125.0000 mg | Freq: Once | INTRAMUSCULAR | Status: AC
  Administered 2022-12-28: 125 mg via INTRAVENOUS
  Filled 2022-12-28: qty 2

## 2022-12-28 MED ORDER — SODIUM CHLORIDE 0.9 % IV SOLN
1000.0000 mg | Freq: Once | INTRAVENOUS | Status: AC
  Administered 2022-12-28: 1000 mg via INTRAVENOUS
  Filled 2022-12-28: qty 10

## 2022-12-28 NOTE — Patient Instructions (Signed)

## 2023-01-20 ENCOUNTER — Encounter: Payer: Self-pay | Admitting: Family

## 2023-01-20 ENCOUNTER — Other Ambulatory Visit (HOSPITAL_BASED_OUTPATIENT_CLINIC_OR_DEPARTMENT_OTHER): Payer: Self-pay

## 2023-01-20 DIAGNOSIS — D649 Anemia, unspecified: Secondary | ICD-10-CM | POA: Diagnosis not present

## 2023-01-20 DIAGNOSIS — D500 Iron deficiency anemia secondary to blood loss (chronic): Secondary | ICD-10-CM | POA: Diagnosis not present

## 2023-01-20 DIAGNOSIS — E669 Obesity, unspecified: Secondary | ICD-10-CM | POA: Diagnosis not present

## 2023-01-20 DIAGNOSIS — R350 Frequency of micturition: Secondary | ICD-10-CM | POA: Diagnosis not present

## 2023-01-20 MED ORDER — ZEPBOUND 5 MG/0.5ML ~~LOC~~ SOAJ: 5.0000 mg | SUBCUTANEOUS | 5 refills | Status: DC

## 2023-01-20 MED ORDER — ZEPBOUND 2.5 MG/0.5ML ~~LOC~~ SOAJ
2.5000 mg | SUBCUTANEOUS | 0 refills | Status: DC
  Filled 2023-01-20: qty 2, 28d supply, fill #0

## 2023-01-21 ENCOUNTER — Other Ambulatory Visit (HOSPITAL_BASED_OUTPATIENT_CLINIC_OR_DEPARTMENT_OTHER): Payer: Self-pay

## 2023-01-22 ENCOUNTER — Other Ambulatory Visit (HOSPITAL_BASED_OUTPATIENT_CLINIC_OR_DEPARTMENT_OTHER): Payer: Self-pay

## 2023-01-22 ENCOUNTER — Encounter: Payer: Self-pay | Admitting: Family

## 2023-01-25 ENCOUNTER — Other Ambulatory Visit (HOSPITAL_BASED_OUTPATIENT_CLINIC_OR_DEPARTMENT_OTHER): Payer: Self-pay

## 2023-01-28 ENCOUNTER — Other Ambulatory Visit (HOSPITAL_BASED_OUTPATIENT_CLINIC_OR_DEPARTMENT_OTHER): Payer: Self-pay

## 2023-02-01 ENCOUNTER — Other Ambulatory Visit (HOSPITAL_BASED_OUTPATIENT_CLINIC_OR_DEPARTMENT_OTHER): Payer: Self-pay

## 2023-02-02 ENCOUNTER — Other Ambulatory Visit (HOSPITAL_BASED_OUTPATIENT_CLINIC_OR_DEPARTMENT_OTHER): Payer: Self-pay

## 2023-02-03 ENCOUNTER — Other Ambulatory Visit (HOSPITAL_BASED_OUTPATIENT_CLINIC_OR_DEPARTMENT_OTHER): Payer: Self-pay

## 2023-02-04 ENCOUNTER — Other Ambulatory Visit (HOSPITAL_BASED_OUTPATIENT_CLINIC_OR_DEPARTMENT_OTHER): Payer: Self-pay

## 2023-02-08 ENCOUNTER — Other Ambulatory Visit (HOSPITAL_BASED_OUTPATIENT_CLINIC_OR_DEPARTMENT_OTHER): Payer: Self-pay

## 2023-02-12 ENCOUNTER — Other Ambulatory Visit (HOSPITAL_BASED_OUTPATIENT_CLINIC_OR_DEPARTMENT_OTHER): Payer: Self-pay

## 2023-02-13 ENCOUNTER — Encounter: Payer: Self-pay | Admitting: Family

## 2023-02-14 ENCOUNTER — Encounter: Payer: Self-pay | Admitting: Family

## 2023-02-17 DIAGNOSIS — Z3009 Encounter for other general counseling and advice on contraception: Secondary | ICD-10-CM | POA: Diagnosis not present

## 2023-02-17 DIAGNOSIS — N939 Abnormal uterine and vaginal bleeding, unspecified: Secondary | ICD-10-CM | POA: Diagnosis not present

## 2023-02-17 DIAGNOSIS — N926 Irregular menstruation, unspecified: Secondary | ICD-10-CM | POA: Diagnosis not present

## 2023-02-17 DIAGNOSIS — Z309 Encounter for contraceptive management, unspecified: Secondary | ICD-10-CM | POA: Diagnosis not present

## 2023-02-19 DIAGNOSIS — Z20822 Contact with and (suspected) exposure to covid-19: Secondary | ICD-10-CM | POA: Diagnosis not present

## 2023-02-19 DIAGNOSIS — J208 Acute bronchitis due to other specified organisms: Secondary | ICD-10-CM | POA: Diagnosis not present

## 2023-02-21 ENCOUNTER — Ambulatory Visit
Admission: EM | Admit: 2023-02-21 | Discharge: 2023-02-21 | Disposition: A | Discharge status: Home / Self Care | Payer: BC Managed Care – PPO | Attending: Nurse Practitioner | Admitting: Nurse Practitioner

## 2023-02-21 DIAGNOSIS — J208 Acute bronchitis due to other specified organisms: Secondary | ICD-10-CM

## 2023-02-21 DIAGNOSIS — B9689 Other specified bacterial agents as the cause of diseases classified elsewhere: Secondary | ICD-10-CM

## 2023-02-21 MED ORDER — AMOXICILLIN-POT CLAVULANATE 875-125 MG PO TABS: 1.0000 | ORAL_TABLET | Freq: Two times a day (BID) | ORAL | 0 refills | Status: AC

## 2023-02-21 MED ORDER — PREDNISONE 20 MG PO TABS: 40.0000 mg | ORAL_TABLET | Freq: Every day | ORAL | 0 refills | Status: AC

## 2023-02-21 NOTE — Discharge Instructions (Signed)
Augmentin twice daily for 10 days Prednisone daily for 5 days Continue Xyzal and over-the-counter cough medicine as needed Rest and fluids Follow-up with your PCP if your symptoms do not improve Please go to the ER for any worsening symptoms

## 2023-02-21 NOTE — ED Provider Notes (Signed)
[facility]    CSN: [id number] Arrival date & time: 02/21/23  1115      History   Chief Complaint Chief Complaint  Patient presents with   Cough    Entered by patient    HPI Jacqueline Wagner is a 32 y.o. female  presents for evaluation of URI symptoms for 8 days. Patient reports associated symptoms of productive cough with purulent phlegm that is worsening, chills. Denies N/V/D, sore throat, ear pain, body aches, shortness of breath. Patient does not have a hx of asthma or smoking. No known sick contacts.  Patient went to urgent care 2 days ago and had a negative flu and COVID test.  She was prescribed Flonase, Xyzal and has been using without improvement.  Pt has taken Robitussin and Delsym OTC for symptoms. Pt has no other concerns at this time.    Cough   Past Medical History:  Diagnosis Date   Anemia    UTI (urinary tract infection)     Patient Active Problem List   Diagnosis Date Noted   S/P cesarean section 11/04/2019   Pregnancy 11/03/2019   IDA (iron deficiency anemia) 03/30/2019   Menorrhagia 03/30/2019   Anemia 03/30/2019   Obesity 03/30/2019   Acute right-sided low back pain without sciatica 12/28/2018   Anterior dislocation of right shoulder 05/20/2017    Past Surgical History:  Procedure Laterality Date   CESAREAN SECTION N/A 11/04/2019   Procedure: CESAREAN SECTION;  Surgeon: Linda Hedges, DO;  Location: [facility];  Service: Obstetrics;  Laterality: N/A;   DILATION AND CURETTAGE OF UTERUS     NO PAST SURGERIES      OB History     Gravida  1   Para  1   Term  1   Preterm      AB      Living  1      SAB      IAB      Ectopic      Multiple  0   Live Births  1            Home Medications    Prior to Admission medications   Medication Sig Start Date End Date Taking? Authorizing Provider  amoxicillin-clavulanate (AUGMENTIN) 875-125 MG tablet Take 1 tablet by mouth every 12 (twelve) hours for 10 days. 02/21/23  03/03/23 Yes Melynda Ripple, NP  predniSONE (DELTASONE) 20 MG tablet Take 2 tablets (40 mg total) by mouth daily with breakfast for 5 days. 02/21/23 02/26/23 Yes Melynda Ripple, NP  levonorgestrel (MIRENA, 52 MG,) 20 MCG/DAY IUD 1 each by Intrauterine route once. 07/24/22   [provider]  medroxyPROGESTERone (PROVERA) 10 MG tablet Take 10 mg by mouth daily. Patient not taking: Reported on 12/22/2022 04/28/22   [provider]  naproxen (NAPROSYN) 500 MG tablet Take 500 mg by mouth 2 (two) times daily with a meal. As needed 12/04/22   [provider]  omeprazole (PRILOSEC) 10 MG capsule Take 10 mg by mouth daily.    [provider]  Polysaccharide-Iron Complex 150 MG CAPS Take 150 mg by mouth 2 (two) times daily. Patient not taking: Reported on 12/22/2022    [provider]  tirzepatide (ZEPBOUND) 2.5 MG/0.5ML Pen Inject 2.5 mg into the skin once a week. Increase to 5 mg in 30 days 01/20/23     tirzepatide (ZEPBOUND) 5 MG/0.5ML Pen Inject 5 mg into the skin once a week. 01/20/23  Family History Family History  Problem Relation Age of Onset   Hypertension Father    Vitamin D deficiency Sister    Stroke Paternal Grandmother    Diabetes Paternal Grandfather     Social History Social History   Tobacco Use   Smoking status: Former    Packs/day: 0.50    Types: Cigarettes   Smokeless tobacco: Never  Vaping Use   Vaping Use: Never used  Substance Use Topics   Alcohol use: Not Currently   Drug use: No     Allergies   Papaya derivatives   Review of Systems Review of Systems  HENT:  Positive for congestion.   Respiratory:  Positive for cough.      Physical Exam Triage Vital Signs ED Triage Vitals  Enc Vitals Group     BP 02/21/23 1130 116/84     Pulse Rate 02/21/23 1130 (!) 104     Resp 02/21/23 1130 18     Temp 02/21/23 1130 99.5 F (37.5 C)     Temp Source 02/21/23 1130 Oral     SpO2 02/21/23 1130 96 %     Weight --      Height  --      Head Circumference --      Peak Flow --      Pain Score 02/21/23 1127 0     Pain Loc --      Pain Edu? --      Excl. in [place]? --    No data found.  Updated Vital Signs BP 116/84 (BP Location: Left Arm)   Pulse (!) 104   Temp 99.5 F (37.5 C) (Oral)   Resp 18   SpO2 96%   Breastfeeding No   Visual Acuity Right Eye Distance:   Left Eye Distance:   Bilateral Distance:    Right Eye Near:   Left Eye Near:    Bilateral Near:     Physical Exam Vitals and nursing note reviewed.  Constitutional:      General: She is not in acute distress.    Appearance: She is well-developed. She is not ill-appearing.  HENT:     Head: Normocephalic and atraumatic.     Right Ear: Tympanic membrane and ear canal normal.     Left Ear: Tympanic membrane and ear canal normal.     Nose: Congestion present.     Mouth/Throat:     Mouth: Mucous membranes are moist.     Pharynx: Oropharynx is clear. Uvula midline. No oropharyngeal exudate or posterior oropharyngeal erythema.     Tonsils: No tonsillar exudate or tonsillar abscesses.  Eyes:     Conjunctiva/sclera: Conjunctivae normal.     Pupils: Pupils are equal, round, and reactive to light.  Cardiovascular:     Rate and Rhythm: Normal rate and regular rhythm.     Heart sounds: Normal heart sounds.  Pulmonary:     Effort: Pulmonary effort is normal.     Breath sounds: Normal breath sounds.  Musculoskeletal:     Cervical back: Normal range of motion and neck supple.  Lymphadenopathy:     Cervical: No cervical adenopathy.  Skin:    General: Skin is warm and dry.  Neurological:     General: No focal deficit present.     Mental Status: She is alert and oriented to person, place, and time.  Psychiatric:        Mood and Affect: Mood normal.        Behavior: Behavior normal.  UC Treatments / Results  Labs (all labs ordered are listed, but only abnormal results are displayed) Labs Reviewed - No data to  display  EKG   Radiology No results found.  Procedures Procedures (including critical care time)  Medications Ordered in UC Medications - No data to display  Initial Impression / Assessment and Plan / UC Course  I have reviewed the triage vital signs and the nursing notes.  Pertinent labs & imaging results that were available during my care of the patient were reviewed by me and considered in my medical decision making (see chart for details).     Start Augmentin twice daily for 10 days Prednisone x 5 days Patient has Rx for Promethazine DM at home and will use as needed Rest and fluids PCP follow-up symptoms do not improve ER precautions reviewed and patient verbalized understanding Final Clinical Impressions(s) / UC Diagnoses   Final diagnoses:  Acute bacterial bronchitis     Discharge Instructions      Augmentin twice daily for 10 days Prednisone daily for 5 days Continue Xyzal and over-the-counter cough medicine as needed Rest and fluids Follow-up with your PCP if your symptoms do not improve Please go to the ER for any worsening symptoms   ED Prescriptions     Medication Sig Dispense Auth. Provider   amoxicillin-clavulanate (AUGMENTIN) 875-125 MG tablet Take 1 tablet by mouth every 12 (twelve) hours for 10 days. 20 tablet Melynda Ripple, NP   predniSONE (DELTASONE) 20 MG tablet Take 2 tablets (40 mg total) by mouth daily with breakfast for 5 days. 10 tablet Melynda Ripple, NP      PDMP not reviewed this encounter.   Melynda Ripple, NP 02/21/23 [phone]

## 2023-02-21 NOTE — ED Triage Notes (Signed)
Pt c/o productive cough that is not getting better.  She reports he tested negative for Covid and flu on Friday.   Started: about a week ago   Home interventions: xyzal, promethazine DM, fluticasone nasal spray

## 2023-02-23 ENCOUNTER — Other Ambulatory Visit (HOSPITAL_BASED_OUTPATIENT_CLINIC_OR_DEPARTMENT_OTHER): Payer: Self-pay

## 2023-02-24 DIAGNOSIS — J9801 Acute bronchospasm: Secondary | ICD-10-CM | POA: Diagnosis not present

## 2023-02-24 DIAGNOSIS — J209 Acute bronchitis, unspecified: Secondary | ICD-10-CM | POA: Diagnosis not present

## 2023-02-24 DIAGNOSIS — R8281 Pyuria: Secondary | ICD-10-CM | POA: Diagnosis not present

## 2023-02-24 DIAGNOSIS — N390 Urinary tract infection, site not specified: Secondary | ICD-10-CM | POA: Diagnosis not present

## 2023-03-08 DIAGNOSIS — J9801 Acute bronchospasm: Secondary | ICD-10-CM | POA: Diagnosis not present

## 2023-03-23 ENCOUNTER — Inpatient Hospital Stay: Payer: BC Managed Care – PPO | Admitting: Family

## 2023-03-23 ENCOUNTER — Inpatient Hospital Stay: Payer: BC Managed Care – PPO

## 2023-03-29 DIAGNOSIS — D509 Iron deficiency anemia, unspecified: Secondary | ICD-10-CM | POA: Diagnosis not present

## 2023-03-30 DIAGNOSIS — K219 Gastro-esophageal reflux disease without esophagitis: Secondary | ICD-10-CM | POA: Diagnosis not present

## 2023-03-30 DIAGNOSIS — D500 Iron deficiency anemia secondary to blood loss (chronic): Secondary | ICD-10-CM | POA: Diagnosis not present

## 2023-04-05 ENCOUNTER — Other Ambulatory Visit: Payer: Self-pay

## 2023-04-05 ENCOUNTER — Inpatient Hospital Stay: Payer: BC Managed Care – PPO

## 2023-04-05 ENCOUNTER — Encounter: Payer: Self-pay | Admitting: Family

## 2023-04-05 ENCOUNTER — Inpatient Hospital Stay: Payer: BC Managed Care – PPO | Attending: Hematology & Oncology | Admitting: Family

## 2023-04-05 VITALS — BP 123/75 | HR 91 | Resp 20

## 2023-04-05 DIAGNOSIS — Z1339 Encounter for screening examination for other mental health and behavioral disorders: Secondary | ICD-10-CM | POA: Diagnosis not present

## 2023-04-05 DIAGNOSIS — D500 Iron deficiency anemia secondary to blood loss (chronic): Secondary | ICD-10-CM | POA: Diagnosis not present

## 2023-04-05 DIAGNOSIS — Z1331 Encounter for screening for depression: Secondary | ICD-10-CM | POA: Diagnosis not present

## 2023-04-05 DIAGNOSIS — D509 Iron deficiency anemia, unspecified: Secondary | ICD-10-CM | POA: Insufficient documentation

## 2023-04-05 DIAGNOSIS — K219 Gastro-esophageal reflux disease without esophagitis: Secondary | ICD-10-CM | POA: Diagnosis not present

## 2023-04-05 DIAGNOSIS — J302 Other seasonal allergic rhinitis: Secondary | ICD-10-CM | POA: Diagnosis not present

## 2023-04-05 DIAGNOSIS — Z0000 Encounter for general adult medical examination without abnormal findings: Secondary | ICD-10-CM | POA: Diagnosis not present

## 2023-04-05 DIAGNOSIS — E669 Obesity, unspecified: Secondary | ICD-10-CM | POA: Diagnosis not present

## 2023-04-05 DIAGNOSIS — L237 Allergic contact dermatitis due to plants, except food: Secondary | ICD-10-CM | POA: Diagnosis not present

## 2023-04-05 NOTE — Progress Notes (Signed)
Hematology and Oncology Follow Up Visit  Jacqueline Wagner [id number] 1991/11/04 33 y.o. 04/05/2023   Principle Diagnosis:  Iron deficiency anemia   Current Therapy:        IV iron as indicated    Interim History:  Jacqueline Wagner is here today with her mother for follow-up. She is doing well and notes that her energy has improved.  Her cycle has been irregular and lasting weeks at a time. She has an appointment this week with her gynecologist to check and make ure her Mirena has not migrated.  No other blood loss noted. No bruising or petechiae.  No fever, chills, n/v, cough, rash, dizziness, SOB, chest pain, palpitations, abdominal pain or changes in bowel or bladder habits.  No tenderness, numbness or tingling in her extremities.  She notes fluid retention at times. This waxes and wanes and is effected by what she has eaten.  No falls or syncope.  Appetite and hydration are good. Weight is stable at 246 lbs.   ECOG Performance Status: 1 - Symptomatic but completely ambulatory  Medications:  Allergies as of 04/05/2023       Reactions   Papaya Derivatives Hives        Medication List        Accurate as of April 05, 2023  2:34 PM. If you have any questions, ask your nurse or doctor.          medroxyPROGESTERone 10 MG tablet Commonly known as: PROVERA Take 10 mg by mouth daily.   Mirena (52 MG) 20 MCG/DAY Iud Generic drug: levonorgestrel 1 each by Intrauterine route once.   naproxen 500 MG tablet Commonly known as: NAPROSYN Take 500 mg by mouth 2 (two) times daily with a meal. As needed   omeprazole 10 MG capsule Commonly known as: PRILOSEC Take 10 mg by mouth daily.   Polysaccharide-Iron Complex 150 MG Caps Take 150 mg by mouth 2 (two) times daily.   Zepbound 2.5 MG/0.5ML Pen Generic drug: tirzepatide Inject 2.5 mg into the skin once a week. Increase to 5 mg in 30 days   Zepbound 5 MG/0.5ML Pen Generic drug: tirzepatide Inject 5 mg into the skin once a  week.        Allergies:  Allergies  Allergen Reactions   Papaya Derivatives Hives    Past Medical History, Surgical history, Social history, and Family History were reviewed and updated.  Review of Systems: All other 10 point review of systems is negative.   Physical Exam:  vitals were not taken for this visit.   Wt Readings from Last 3 Encounters:  12/22/22 238 lb 1.9 oz (108 kg)  12/04/22 230 lb (104.3 kg)  10/23/22 229 lb (103.9 kg)    Ocular: Sclerae unicteric, pupils equal, round and reactive to light Ear-nose-throat: Oropharynx clear, dentition fair Lymphatic: No cervical or supraclavicular adenopathy Lungs no rales or rhonchi, good excursion bilaterally Heart regular rate and rhythm, no murmur appreciated Abd soft, nontender, positive bowel sounds MSK no focal spinal tenderness, no joint edema Neuro: non-focal, well-oriented, appropriate affect Breasts: Deferred   Lab Results  Component Value Date   WBC 9.5 12/22/2022   HGB 11.6 (L) 12/22/2022   HCT 38.0 12/22/2022   MCV 74.1 (L) 12/22/2022   PLT 338 12/22/2022   Lab Results  Component Value Date   FERRITIN 80 12/22/2022   IRON 26 (L) 12/22/2022   TIBC 361 12/22/2022   UIBC 335 12/22/2022   IRONPCTSAT 7 (L) 12/22/2022   Lab Results  Component Value Date   RETICCTPCT 1.0 12/22/2022   RBC 5.13 (H) 12/22/2022   RBC 5.08 12/22/2022   No results found for: "KPAFRELGTCHN", "LAMBDASER", "KAPLAMBRATIO" No results found for: "IGGSERUM", "IGA", "IGMSERUM" No results found for: "TOTALPROTELP", "ALBUMINELP", "A1GS", "A2GS", "BETS", "BETA2SER", "GAMS", "MSPIKE", "SPEI"   Chemistry      Component Value Date/Time   NA 140 12/22/2022 1319   K 3.7 12/22/2022 1319   CL 104 12/22/2022 1319   CO2 26 12/22/2022 1319   BUN 9 12/22/2022 1319   CREATININE 0.70 12/22/2022 1319      Component Value Date/Time   CALCIUM 9.0 12/22/2022 1319   ALKPHOS 63 12/22/2022 1319   AST 19 12/22/2022 1319   ALT 28 12/22/2022  1319   BILITOT 0.3 12/22/2022 1319       Impression and Plan: Jacqueline Wagner is a very pleasant 32 yo Filipino female with iron deficiency anemia.  Iron studies are pending. We will replace if needed.  Follow-up in 4 months.   Eileen Stanford, NP 4/22/20242:34 PM

## 2023-04-08 DIAGNOSIS — D649 Anemia, unspecified: Secondary | ICD-10-CM | POA: Diagnosis not present

## 2023-04-08 DIAGNOSIS — Z30431 Encounter for routine checking of intrauterine contraceptive device: Secondary | ICD-10-CM | POA: Diagnosis not present

## 2023-04-08 DIAGNOSIS — N926 Irregular menstruation, unspecified: Secondary | ICD-10-CM | POA: Diagnosis not present

## 2023-04-08 DIAGNOSIS — N939 Abnormal uterine and vaginal bleeding, unspecified: Secondary | ICD-10-CM | POA: Diagnosis not present

## 2023-04-12 DIAGNOSIS — Z3202 Encounter for pregnancy test, result negative: Secondary | ICD-10-CM | POA: Diagnosis not present

## 2023-04-12 DIAGNOSIS — Z30433 Encounter for removal and reinsertion of intrauterine contraceptive device: Secondary | ICD-10-CM | POA: Diagnosis not present

## 2023-06-04 DIAGNOSIS — Z3202 Encounter for pregnancy test, result negative: Secondary | ICD-10-CM | POA: Diagnosis not present

## 2023-06-04 DIAGNOSIS — Z30431 Encounter for routine checking of intrauterine contraceptive device: Secondary | ICD-10-CM | POA: Diagnosis not present

## 2023-06-04 DIAGNOSIS — N926 Irregular menstruation, unspecified: Secondary | ICD-10-CM | POA: Diagnosis not present

## 2023-07-05 ENCOUNTER — Telehealth: Payer: Self-pay | Admitting: *Deleted

## 2023-07-05 NOTE — Telephone Encounter (Signed)
Called patient and lvm of new appointment, requested call back to confirm.

## 2023-08-03 ENCOUNTER — Encounter: Payer: Self-pay | Admitting: Medical Oncology

## 2023-08-03 ENCOUNTER — Inpatient Hospital Stay (HOSPITAL_BASED_OUTPATIENT_CLINIC_OR_DEPARTMENT_OTHER): Payer: BC Managed Care – PPO | Admitting: Medical Oncology

## 2023-08-03 ENCOUNTER — Inpatient Hospital Stay: Payer: BC Managed Care – PPO | Attending: Family

## 2023-08-03 ENCOUNTER — Other Ambulatory Visit: Payer: Self-pay

## 2023-08-03 VITALS — BP 102/61 | HR 104 | Temp 99.2°F | Resp 18 | Ht 64.0 in | Wt 248.0 lb

## 2023-08-03 DIAGNOSIS — D510 Vitamin B12 deficiency anemia due to intrinsic factor deficiency: Secondary | ICD-10-CM | POA: Diagnosis not present

## 2023-08-03 DIAGNOSIS — Z79899 Other long term (current) drug therapy: Secondary | ICD-10-CM | POA: Insufficient documentation

## 2023-08-03 DIAGNOSIS — D500 Iron deficiency anemia secondary to blood loss (chronic): Secondary | ICD-10-CM | POA: Diagnosis not present

## 2023-08-03 DIAGNOSIS — D509 Iron deficiency anemia, unspecified: Secondary | ICD-10-CM | POA: Diagnosis not present

## 2023-08-03 LAB — CBC WITH DIFFERENTIAL (CANCER CENTER ONLY)
Abs Immature Granulocytes: 0.1 10*3/uL — ABNORMAL HIGH (ref 0.00–0.07)
Basophils Absolute: 0 10*3/uL (ref 0.0–0.1)
Basophils Relative: 0 %
Eosinophils Absolute: 0.2 10*3/uL (ref 0.0–0.5)
Eosinophils Relative: 2 %
HCT: 41.2 % (ref 36.0–46.0)
Hemoglobin: 13.2 g/dL (ref 12.0–15.0)
Immature Granulocytes: 1 %
Lymphocytes Relative: 33 %
Lymphs Abs: 2.7 10*3/uL (ref 0.7–4.0)
MCH: 27.8 pg (ref 26.0–34.0)
MCHC: 32 g/dL (ref 30.0–36.0)
MCV: 86.7 fL (ref 80.0–100.0)
Monocytes Absolute: 0.6 10*3/uL (ref 0.1–1.0)
Monocytes Relative: 8 %
Neutro Abs: 4.7 10*3/uL (ref 1.7–7.7)
Neutrophils Relative %: 56 %
Platelet Count: 299 10*3/uL (ref 150–400)
RBC: 4.75 MIL/uL (ref 3.87–5.11)
RDW: 13.3 % (ref 11.5–15.5)
WBC Count: 8.3 10*3/uL (ref 4.0–10.5)
nRBC: 0 % (ref 0.0–0.2)

## 2023-08-03 LAB — FERRITIN: Ferritin: 269 ng/mL (ref 11–307)

## 2023-08-03 LAB — RETICULOCYTES
Immature Retic Fract: 10 % (ref 2.3–15.9)
RBC.: 4.73 MIL/uL (ref 3.87–5.11)
Retic Count, Absolute: 86.6 10*3/uL (ref 19.0–186.0)
Retic Ct Pct: 1.8 % (ref 0.4–3.1)

## 2023-08-03 NOTE — Progress Notes (Signed)
Hematology and Oncology Follow Up Visit  Jacqueline Wagner [id number] 01-27-1991 32 y.o. 08/03/2023   Principle Diagnosis:  Iron deficiency anemia   Current Therapy:        IV iron as indicated    Interim History:  Jacqueline Wagner is here today with her husband.  She reports that she has been fatigued but has been moving.  Mirena still in place. Menstrual cycles are light No other blood loss noted. No bruising or petechiae.  No fever, chills, n/v, cough, rash, dizziness, SOB, chest pain, palpitations, abdominal pain or changes in bowel or bladder habits.  No tenderness, numbness or tingling in her extremities.  No falls or syncope.  Appetite and hydration are good.   Wt Readings from Last 3 Encounters:  08/03/23 248 lb (112.5 kg)  12/22/22 238 lb 1.9 oz (108 kg)  12/04/22 230 lb (104.3 kg)     ECOG Performance Status: 1 - Symptomatic but completely ambulatory  Medications:  Allergies as of 08/03/2023       Reactions   Ferrous Sulfate Nausea Only   Papaya Derivatives Hives        Medication List        Accurate as of August 03, 2023  2:57 PM. If you have any questions, ask your nurse or doctor.          STOP taking these medications    medroxyPROGESTERone 10 MG tablet Commonly known as: PROVERA Stopped by: Rushie Chestnut   Polysaccharide-Iron Complex 150 MG Caps Stopped by: Brand Males Anevay Campanella   Zepbound 2.5 MG/0.5ML Pen Generic drug: tirzepatide Stopped by: Rushie Chestnut   Zepbound 5 MG/0.5ML Pen Generic drug: tirzepatide Stopped by: Rushie Chestnut       TAKE these medications    Mirena (52 MG) 20 MCG/DAY Iud Generic drug: levonorgestrel 1 each by Intrauterine route once.   naproxen 500 MG tablet Commonly known as: NAPROSYN Take 500 mg by mouth 2 (two) times daily with a meal. As needed   omeprazole 10 MG capsule Commonly known as: PRILOSEC Take 10 mg by mouth daily.        Allergies:  Allergies  Allergen Reactions   Ferrous  Sulfate Nausea Only   Papaya Derivatives Hives    Past Medical History, Surgical history, Social history, and Family History were reviewed and updated.  Review of Systems: All other 10 point review of systems is negative.   Physical Exam:  height is 5\' 4"  (1.626 m) and weight is 248 lb (112.5 kg). Her oral temperature is 99.2 F (37.3 C). Her blood pressure is 102/61 and her pulse is 104 (abnormal). Her respiration is 18 and oxygen saturation is 100%.   Wt Readings from Last 3 Encounters:  08/03/23 248 lb (112.5 kg)  12/22/22 238 lb 1.9 oz (108 kg)  12/04/22 230 lb (104.3 kg)    Ocular: Sclerae unicteric, pupils equal, round and reactive to light Ear-nose-throat: Oropharynx clear, dentition fair Lymphatic: No cervical or supraclavicular adenopathy Lungs no rales or rhonchi, good excursion bilaterally Heart regular rate and rhythm, no murmur appreciated Abd soft, nontender, positive bowel sounds MSK no focal spinal tenderness, no joint edema Neuro: non-focal, well-oriented, appropriate affect  Lab Results  Component Value Date   WBC 8.3 08/03/2023   HGB 13.2 08/03/2023   HCT 41.2 08/03/2023   MCV 86.7 08/03/2023   PLT 299 08/03/2023   Lab Results  Component Value Date   FERRITIN 80 12/22/2022   IRON 26 (L) 12/22/2022  TIBC 361 12/22/2022   UIBC 335 12/22/2022   IRONPCTSAT 7 (L) 12/22/2022   Lab Results  Component Value Date   RETICCTPCT 1.8 08/03/2023   RBC 4.75 08/03/2023   RBC 4.73 08/03/2023   No results found for: "KPAFRELGTCHN", "LAMBDASER", "KAPLAMBRATIO" No results found for: "IGGSERUM", "IGA", "IGMSERUM" No results found for: "TOTALPROTELP", "ALBUMINELP", "A1GS", "A2GS", "BETS", "BETA2SER", "GAMS", "MSPIKE", "SPEI"   Chemistry      Component Value Date/Time   NA 140 12/22/2022 1319   K 3.7 12/22/2022 1319   CL 104 12/22/2022 1319   CO2 26 12/22/2022 1319   BUN 9 12/22/2022 1319   CREATININE 0.70 12/22/2022 1319      Component Value Date/Time    CALCIUM 9.0 12/22/2022 1319   ALKPHOS 63 12/22/2022 1319   AST 19 12/22/2022 1319   ALT 28 12/22/2022 1319   BILITOT 0.3 12/22/2022 1319       Impression and Plan: Jacqueline Wagner is a very pleasant 32 yo Filipino female with iron deficiency anemia.   Iron studies are pending. We will replace if needed.  RTC 4 months APP, labs   Rushie Chestnut, [place] 8/20/20242:57 PM

## 2023-08-04 ENCOUNTER — Inpatient Hospital Stay: Payer: BC Managed Care – PPO

## 2023-08-04 ENCOUNTER — Ambulatory Visit: Payer: BC Managed Care – PPO | Admitting: Family

## 2023-08-04 DIAGNOSIS — D500 Iron deficiency anemia secondary to blood loss (chronic): Secondary | ICD-10-CM | POA: Diagnosis not present

## 2023-08-04 LAB — IRON AND IRON BINDING CAPACITY (CC-WL,HP ONLY)
Iron: 32 ug/dL (ref 28–170)
Saturation Ratios: 9 % — ABNORMAL LOW (ref 10.4–31.8)
TIBC: 361 ug/dL (ref 250–450)
UIBC: 329 ug/dL (ref 148–442)

## 2023-08-27 ENCOUNTER — Inpatient Hospital Stay: Payer: BC Managed Care – PPO | Attending: Family

## 2023-08-27 VITALS — BP 108/67 | HR 76 | Temp 99.1°F

## 2023-08-27 DIAGNOSIS — D509 Iron deficiency anemia, unspecified: Secondary | ICD-10-CM | POA: Insufficient documentation

## 2023-08-27 DIAGNOSIS — D500 Iron deficiency anemia secondary to blood loss (chronic): Secondary | ICD-10-CM

## 2023-08-27 MED ORDER — SODIUM CHLORIDE 0.9 % IV SOLN: Freq: Once | INTRAVENOUS | Status: AC

## 2023-08-27 MED ORDER — SODIUM CHLORIDE 0.9 % IV SOLN
1000.0000 mg | Freq: Once | INTRAVENOUS | Status: AC
  Administered 2023-08-27: 1000 mg via INTRAVENOUS
  Filled 2023-08-27: qty 10

## 2023-08-27 NOTE — Patient Instructions (Signed)

## 2023-09-09 DIAGNOSIS — B360 Pityriasis versicolor: Secondary | ICD-10-CM | POA: Diagnosis not present

## 2023-09-21 DIAGNOSIS — D3611 Benign neoplasm of peripheral nerves and autonomic nervous system of face, head, and neck: Secondary | ICD-10-CM | POA: Diagnosis not present

## 2023-09-21 DIAGNOSIS — L918 Other hypertrophic disorders of the skin: Secondary | ICD-10-CM | POA: Diagnosis not present

## 2023-09-21 DIAGNOSIS — D485 Neoplasm of uncertain behavior of skin: Secondary | ICD-10-CM | POA: Diagnosis not present

## 2023-10-15 ENCOUNTER — Ambulatory Visit
Admission: RE | Admit: 2023-10-15 | Discharge: 2023-10-15 | Disposition: A | Discharge status: Home / Self Care | Payer: BC Managed Care – PPO | Source: Ambulatory Visit | Attending: Family Medicine | Admitting: Family Medicine

## 2023-10-15 VITALS — BP 130/85 | HR 96 | Temp 98.5°F | Resp 16

## 2023-10-15 DIAGNOSIS — J40 Bronchitis, not specified as acute or chronic: Secondary | ICD-10-CM

## 2023-10-15 DIAGNOSIS — R059 Cough, unspecified: Secondary | ICD-10-CM | POA: Diagnosis not present

## 2023-10-15 MED ORDER — BENZONATATE 200 MG PO CAPS: 200.0000 mg | ORAL_CAPSULE | Freq: Three times a day (TID) | ORAL | 0 refills | Status: AC | PRN

## 2023-10-15 MED ORDER — PREDNISONE 10 MG (21) PO TBPK: ORAL_TABLET | Freq: Every day | ORAL | 0 refills | Status: AC

## 2023-10-15 MED ORDER — DOXYCYCLINE HYCLATE 100 MG PO CAPS: 100.0000 mg | ORAL_CAPSULE | Freq: Two times a day (BID) | ORAL | 0 refills | Status: AC

## 2023-10-15 NOTE — Discharge Instructions (Addendum)
Advised patient to take medications as directed with food to completion.  Advised patient to take prednisone with first dose of Doxycycline until complete.  Advised may use Tessalon capsules daily or as needed for cough.  Encouraged to increase daily water intake to 64 ounces per day while taking these medications.  Advised if symptoms worsen and/or unresolved please follow-up with PCP or here for further evaluation.

## 2023-10-15 NOTE — ED Triage Notes (Signed)
Pt reports a severe cough since last Monday. States she was sick last Monday with congestion and has had a lingering cough since then. Has tried taking vicks vapor rub, rx cough medicine at night and a humidifier.

## 2023-10-15 NOTE — ED Provider Notes (Signed)
[name]    CSN: [id number] Arrival date & time: 10/15/23  1723      History   Chief Complaint Chief Complaint  Patient presents with   Cough    Severe cough - Entered by patient    HPI Jacqueline Wagner is a 32 y.o. female.   HPI 32 year old female presents with severe cough.  PMH significant for obesity, anemia, and acute right-sided low back pain without sciatica.  Past Medical History:  Diagnosis Date   Anemia    UTI (urinary tract infection)     Patient Active Problem List   Diagnosis Date Noted   S/P cesarean section 11/04/2019   Pregnancy 11/03/2019   IDA (iron deficiency anemia) 03/30/2019   Menorrhagia 03/30/2019   Anemia 03/30/2019   Obesity 03/30/2019   Acute right-sided low back pain without sciatica 12/28/2018   Anterior dislocation of right shoulder 05/20/2017    Past Surgical History:  Procedure Laterality Date   CESAREAN SECTION N/A 11/04/2019   Procedure: CESAREAN SECTION;  Surgeon: Mitchel Honour, DO;  Location: [facility];  Service: Obstetrics;  Laterality: N/A;   DILATION AND CURETTAGE OF UTERUS     NO PAST SURGERIES      OB History     Gravida  1   Para  1   Term  1   Preterm      AB      Living  1      SAB      IAB      Ectopic      Multiple  0   Live Births  1            Home Medications    Prior to Admission medications   Medication Sig Start Date End Date Taking? Authorizing Provider  benzonatate (TESSALON) 200 MG capsule Take 1 capsule (200 mg total) by mouth 3 (three) times daily as needed for up to 7 days. 10/15/23 10/22/23 Yes Trevor Iha, FNP  doxycycline (VIBRAMYCIN) 100 MG capsule Take 1 capsule (100 mg total) by mouth 2 (two) times daily for 7 days. 10/15/23 10/22/23 Yes Trevor Iha, FNP  predniSONE (STERAPRED UNI-PAK 21 TAB) 10 MG (21) TBPK tablet Take by mouth daily. Take 6 tabs by mouth daily  for 2 days, then 5 tabs for 2 days, then 4 tabs for 2 days, then 3 tabs for 2 days, 2 tabs  for 2 days, then 1 tab by mouth daily for 2 days 10/15/23  Yes Trevor Iha, FNP  levonorgestrel (MIRENA, 52 MG,) 20 MCG/DAY IUD 1 each by Intrauterine route once. 07/24/22   [provider]  naproxen (NAPROSYN) 500 MG tablet Take 500 mg by mouth 2 (two) times daily with a meal. As needed 12/04/22   [provider]  omeprazole (PRILOSEC) 10 MG capsule Take 10 mg by mouth daily.    [provider]    Family History Family History  Problem Relation Age of Onset   Hypertension Father    Vitamin D deficiency Sister    Stroke Paternal Grandmother    Diabetes Paternal Grandfather     Social History Social History   Tobacco Use   Smoking status: Former    Current packs/day: 0.50    Types: Cigarettes   Smokeless tobacco: Never  Vaping Use   Vaping status: Never Used  Substance Use Topics   Alcohol use: Not Currently   Drug use: No     Allergies   Ferrous sulfate and Papaya derivatives  Review of Systems Review of Systems  Respiratory:  Positive for cough.   All other systems reviewed and are negative.    Physical Exam Triage Vital Signs ED Triage Vitals  Encounter Vitals Group     BP      Systolic BP Percentile      Diastolic BP Percentile      Pulse      Resp      Temp      Temp src      SpO2      Weight      Height      Head Circumference      Peak Flow      Pain Score      Pain Loc      Pain Education      Exclude from Growth Chart    No data found.  Updated Vital Signs BP 130/85 (BP Location: Left Arm)   Pulse 96   Temp 98.5 F (36.9 C) (Oral)   Resp 16   SpO2 97%    Physical Exam Vitals and nursing note reviewed.  Constitutional:      Appearance: Normal appearance. She is obese. She is ill-appearing.  HENT:     Head: Normocephalic and atraumatic.     Right Ear: Tympanic membrane, ear canal and external ear normal.     Left Ear: Tympanic membrane, ear canal and external ear normal.     Mouth/Throat:     Mouth:  Mucous membranes are moist.     Pharynx: Oropharynx is clear.  Eyes:     Extraocular Movements: Extraocular movements intact.     Conjunctiva/sclera: Conjunctivae normal.     Pupils: Pupils are equal, round, and reactive to light.  Cardiovascular:     Rate and Rhythm: Normal rate and regular rhythm.     Pulses: Normal pulses.     Heart sounds: Normal heart sounds. No murmur heard. Pulmonary:     Effort: Pulmonary effort is normal.     Breath sounds: Rhonchi present. No wheezing or rales.     Comments: Diffuse scattered rhonchi noted throughout, with frequent nonproductive cough on exam Chest:     Chest wall: No tenderness.  Musculoskeletal:        General: Normal range of motion.     Cervical back: Normal range of motion and neck supple.  Skin:    General: Skin is warm and dry.  Neurological:     General: No focal deficit present.     Mental Status: She is alert and oriented to person, place, and time. Mental status is at baseline.  Psychiatric:        Mood and Affect: Mood normal.        Behavior: Behavior normal.      UC Treatments / Results  Labs (all labs ordered are listed, but only abnormal results are displayed) Labs Reviewed - No data to display  EKG   Radiology No results found.  Procedures Procedures (including critical care time)  Medications Ordered in UC Medications - No data to display  Initial Impression / Assessment and Plan / UC Course  I have reviewed the triage vital signs and the nursing notes.  Pertinent labs & imaging results that were available during my care of the patient were reviewed by me and considered in my medical decision making (see chart for details).     MDM: 1.  Cough, unspecified type-Rx Doxycycline 100 mg capsule: Take 1 capsule twice daily x 7  days, Rx'd Tessalon 200 mg capsules: Take 1 capsule 3 times daily, as needed for cough; 2.  Bronchitis-Rx Sterapred [place] (tapering from 60 mg to 10 mg over 10 days. Advised patient  to take medications as directed with food to completion.  Advised patient to take prednisone with first dose of Doxycycline until complete.  Advised may use Tessalon capsules daily or as needed for cough.  Encouraged to increase daily water intake to 64 ounces per day while taking these medications.  Advised if symptoms worsen and/or unresolved please follow-up with PCP or here for further evaluation.  Patient discharged home, hemodynamically stable. Final Clinical Impressions(s) / UC Diagnoses   Final diagnoses:  Cough, unspecified type  Bronchitis     Discharge Instructions      Advised patient to take medications as directed with food to completion.  Advised patient to take prednisone with first dose of Doxycycline until complete.  Advised may use Tessalon capsules daily or as needed for cough.  Encouraged to increase daily water intake to 64 ounces per day while taking these medications.  Advised if symptoms worsen and/or unresolved please follow-up with PCP or here for further evaluation.     ED Prescriptions     Medication Sig Dispense Auth. Provider   doxycycline (VIBRAMYCIN) 100 MG capsule Take 1 capsule (100 mg total) by mouth 2 (two) times daily for 7 days. 14 capsule Trevor Iha, FNP   predniSONE (STERAPRED UNI-PAK 21 TAB) 10 MG (21) TBPK tablet Take by mouth daily. Take 6 tabs by mouth daily  for 2 days, then 5 tabs for 2 days, then 4 tabs for 2 days, then 3 tabs for 2 days, 2 tabs for 2 days, then 1 tab by mouth daily for 2 days 42 tablet Trevor Iha, FNP   benzonatate (TESSALON) 200 MG capsule Take 1 capsule (200 mg total) by mouth 3 (three) times daily as needed for up to 7 days. 40 capsule Trevor Iha, FNP      PDMP not reviewed this encounter.   Trevor Iha, FNP 10/15/23 1823

## 2023-10-30 DIAGNOSIS — J301 Allergic rhinitis due to pollen: Secondary | ICD-10-CM | POA: Diagnosis not present

## 2023-10-30 DIAGNOSIS — R399 Unspecified symptoms and signs involving the genitourinary system: Secondary | ICD-10-CM | POA: Diagnosis not present

## 2023-10-30 DIAGNOSIS — R058 Other specified cough: Secondary | ICD-10-CM | POA: Diagnosis not present

## 2023-11-04 DIAGNOSIS — N3001 Acute cystitis with hematuria: Secondary | ICD-10-CM | POA: Diagnosis not present

## 2023-11-04 DIAGNOSIS — Z87891 Personal history of nicotine dependence: Secondary | ICD-10-CM | POA: Diagnosis not present

## 2023-11-04 DIAGNOSIS — R059 Cough, unspecified: Secondary | ICD-10-CM | POA: Diagnosis not present

## 2023-11-04 DIAGNOSIS — M549 Dorsalgia, unspecified: Secondary | ICD-10-CM | POA: Diagnosis not present

## 2023-11-04 DIAGNOSIS — X58XXXA Exposure to other specified factors, initial encounter: Secondary | ICD-10-CM | POA: Diagnosis not present

## 2023-11-04 DIAGNOSIS — R319 Hematuria, unspecified: Secondary | ICD-10-CM | POA: Diagnosis not present

## 2023-11-04 DIAGNOSIS — Z87440 Personal history of urinary (tract) infections: Secondary | ICD-10-CM | POA: Diagnosis not present

## 2023-11-04 DIAGNOSIS — T8332XA Displacement of intrauterine contraceptive device, initial encounter: Secondary | ICD-10-CM | POA: Diagnosis not present

## 2023-11-04 DIAGNOSIS — N390 Urinary tract infection, site not specified: Secondary | ICD-10-CM | POA: Diagnosis not present

## 2023-11-04 DIAGNOSIS — R058 Other specified cough: Secondary | ICD-10-CM | POA: Diagnosis not present

## 2023-11-09 DIAGNOSIS — T8332XA Displacement of intrauterine contraceptive device, initial encounter: Secondary | ICD-10-CM | POA: Diagnosis not present

## 2023-11-09 DIAGNOSIS — Z30431 Encounter for routine checking of intrauterine contraceptive device: Secondary | ICD-10-CM | POA: Diagnosis not present

## 2023-11-16 DIAGNOSIS — J209 Acute bronchitis, unspecified: Secondary | ICD-10-CM | POA: Diagnosis not present

## 2023-11-16 DIAGNOSIS — R058 Other specified cough: Secondary | ICD-10-CM | POA: Diagnosis not present

## 2023-11-17 ENCOUNTER — Encounter: Payer: Self-pay | Admitting: Family

## 2023-12-02 ENCOUNTER — Inpatient Hospital Stay: Payer: BC Managed Care – PPO | Attending: Family

## 2023-12-02 ENCOUNTER — Encounter: Payer: Self-pay | Admitting: Family

## 2023-12-02 ENCOUNTER — Inpatient Hospital Stay: Payer: BC Managed Care – PPO | Admitting: Medical Oncology

## 2023-12-03 DIAGNOSIS — Z3202 Encounter for pregnancy test, result negative: Secondary | ICD-10-CM | POA: Diagnosis not present

## 2023-12-03 DIAGNOSIS — Z30433 Encounter for removal and reinsertion of intrauterine contraceptive device: Secondary | ICD-10-CM | POA: Diagnosis not present

## 2023-12-03 DIAGNOSIS — Z30431 Encounter for routine checking of intrauterine contraceptive device: Secondary | ICD-10-CM | POA: Diagnosis not present

## 2024-02-16 DIAGNOSIS — Z01419 Encounter for gynecological examination (general) (routine) without abnormal findings: Secondary | ICD-10-CM | POA: Diagnosis not present

## 2024-02-16 DIAGNOSIS — N760 Acute vaginitis: Secondary | ICD-10-CM | POA: Diagnosis not present

## 2024-02-16 DIAGNOSIS — Z6841 Body Mass Index (BMI) 40.0 and over, adult: Secondary | ICD-10-CM | POA: Diagnosis not present

## 2024-04-11 DIAGNOSIS — K219 Gastro-esophageal reflux disease without esophagitis: Secondary | ICD-10-CM | POA: Diagnosis not present

## 2024-04-11 DIAGNOSIS — D509 Iron deficiency anemia, unspecified: Secondary | ICD-10-CM | POA: Diagnosis not present

## 2024-04-18 DIAGNOSIS — K219 Gastro-esophageal reflux disease without esophagitis: Secondary | ICD-10-CM | POA: Diagnosis not present

## 2024-04-18 DIAGNOSIS — Z1339 Encounter for screening examination for other mental health and behavioral disorders: Secondary | ICD-10-CM | POA: Diagnosis not present

## 2024-04-18 DIAGNOSIS — Z0000 Encounter for general adult medical examination without abnormal findings: Secondary | ICD-10-CM | POA: Diagnosis not present

## 2024-04-18 DIAGNOSIS — Z1331 Encounter for screening for depression: Secondary | ICD-10-CM | POA: Diagnosis not present

## 2024-06-08 DIAGNOSIS — M7989 Other specified soft tissue disorders: Secondary | ICD-10-CM | POA: Diagnosis not present

## 2024-06-08 DIAGNOSIS — M79606 Pain in leg, unspecified: Secondary | ICD-10-CM | POA: Diagnosis not present

## 2024-06-08 DIAGNOSIS — Z87891 Personal history of nicotine dependence: Secondary | ICD-10-CM | POA: Diagnosis not present

## 2024-06-08 DIAGNOSIS — M79605 Pain in left leg: Secondary | ICD-10-CM | POA: Diagnosis not present

## 2024-06-08 DIAGNOSIS — Z86718 Personal history of other venous thrombosis and embolism: Secondary | ICD-10-CM | POA: Diagnosis not present

## 2024-06-08 DIAGNOSIS — R600 Localized edema: Secondary | ICD-10-CM | POA: Diagnosis not present

## 2024-06-09 DIAGNOSIS — N62 Hypertrophy of breast: Secondary | ICD-10-CM | POA: Diagnosis not present

## 2024-06-20 DIAGNOSIS — D369 Benign neoplasm, unspecified site: Secondary | ICD-10-CM | POA: Diagnosis not present

## 2024-06-20 DIAGNOSIS — D2239 Melanocytic nevi of other parts of face: Secondary | ICD-10-CM | POA: Diagnosis not present

## 2024-08-28 DIAGNOSIS — R319 Hematuria, unspecified: Secondary | ICD-10-CM | POA: Diagnosis not present

## 2024-08-28 DIAGNOSIS — N771 Vaginitis, vulvitis and vulvovaginitis in diseases classified elsewhere: Secondary | ICD-10-CM | POA: Diagnosis not present

## 2024-09-07 DIAGNOSIS — R0789 Other chest pain: Secondary | ICD-10-CM | POA: Diagnosis not present

## 2024-09-07 DIAGNOSIS — K219 Gastro-esophageal reflux disease without esophagitis: Secondary | ICD-10-CM | POA: Diagnosis not present

## 2024-09-07 DIAGNOSIS — R079 Chest pain, unspecified: Secondary | ICD-10-CM | POA: Diagnosis not present

## 2024-09-07 DIAGNOSIS — K224 Dyskinesia of esophagus: Secondary | ICD-10-CM | POA: Diagnosis not present

## 2024-10-24 DIAGNOSIS — G4730 Sleep apnea, unspecified: Secondary | ICD-10-CM | POA: Diagnosis not present

## 2024-10-24 DIAGNOSIS — G2581 Restless legs syndrome: Secondary | ICD-10-CM | POA: Diagnosis not present

## 2024-11-27 DIAGNOSIS — G4733 Obstructive sleep apnea (adult) (pediatric): Secondary | ICD-10-CM | POA: Diagnosis not present

## 2024-12-10 DIAGNOSIS — B9689 Other specified bacterial agents as the cause of diseases classified elsewhere: Secondary | ICD-10-CM | POA: Diagnosis not present

## 2024-12-10 DIAGNOSIS — J209 Acute bronchitis, unspecified: Secondary | ICD-10-CM | POA: Diagnosis not present

## 2024-12-10 DIAGNOSIS — R000 Tachycardia, unspecified: Secondary | ICD-10-CM | POA: Diagnosis not present
